# Patient Record
Sex: Male | Born: 1990 | Race: White | Hispanic: No | Marital: Single | Smoking: Current every day smoker
Health system: Southern US, Community
[De-identification: ages and names within clinical notes are randomized; demographics above are authoritative.]

## PROBLEM LIST (undated history)

## (undated) DIAGNOSIS — Z720 Tobacco use: Secondary | ICD-10-CM

## (undated) HISTORY — PX: FEMUR FRACTURE SURGERY: SHX633

## (undated) HISTORY — PX: PELVIC FRACTURE SURGERY: SHX119

---
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
## 2014-10-28 ENCOUNTER — Emergency Department: Payer: Self-pay | Admitting: Emergency Medicine

## 2014-11-17 ENCOUNTER — Emergency Department: Payer: Self-pay | Admitting: Emergency Medicine

## 2015-04-15 ENCOUNTER — Encounter (HOSPITAL_COMMUNITY): Payer: Self-pay | Admitting: Emergency Medicine

## 2015-04-15 ENCOUNTER — Emergency Department (HOSPITAL_COMMUNITY)
Admission: EM | Admit: 2015-04-15 | Discharge: 2015-04-15 | Disposition: A | Discharge status: Home / Self Care | Payer: Self-pay | Attending: Emergency Medicine | Admitting: Emergency Medicine

## 2015-04-15 ENCOUNTER — Emergency Department (HOSPITAL_COMMUNITY): Payer: Self-pay

## 2015-04-15 DIAGNOSIS — N453 Epididymo-orchitis: Secondary | ICD-10-CM | POA: Insufficient documentation

## 2015-04-15 DIAGNOSIS — Z720 Tobacco use: Secondary | ICD-10-CM | POA: Insufficient documentation

## 2015-04-15 DIAGNOSIS — Z9889 Other specified postprocedural states: Secondary | ICD-10-CM | POA: Insufficient documentation

## 2015-04-15 DIAGNOSIS — N5089 Other specified disorders of the male genital organs: Secondary | ICD-10-CM

## 2015-04-15 LAB — URINE MICROSCOPIC-ADD ON

## 2015-04-15 LAB — URINALYSIS, ROUTINE W REFLEX MICROSCOPIC
Bilirubin Urine: NEGATIVE
Glucose, UA: NEGATIVE mg/dL
Ketones, ur: NEGATIVE mg/dL
NITRITE: POSITIVE — AB
Protein, ur: NEGATIVE mg/dL
Specific Gravity, Urine: 1.02 (ref 1.005–1.030)
Urobilinogen, UA: 2 mg/dL — ABNORMAL HIGH (ref 0.0–1.0)
pH: 6.5 (ref 5.0–8.0)

## 2015-04-15 MED ORDER — NAPROXEN 500 MG PO TABS: 500.0000 mg | ORAL_TABLET | Freq: Two times a day (BID) | ORAL | Status: DC

## 2015-04-15 MED ORDER — KETOROLAC TROMETHAMINE 30 MG/ML IJ SOLN: 30.0000 mg | Freq: Once | INTRAMUSCULAR | Status: DC

## 2015-04-15 MED ORDER — LIDOCAINE HCL (PF) 1 % IJ SOLN
INTRAMUSCULAR | Status: AC
  Administered 2015-04-15: 15:00:00
  Filled 2015-04-15: qty 5

## 2015-04-15 MED ORDER — CEFTRIAXONE SODIUM 250 MG IJ SOLR
250.0000 mg | Freq: Once | INTRAMUSCULAR | Status: AC
  Administered 2015-04-15: 250 mg via INTRAMUSCULAR
  Filled 2015-04-15: qty 250

## 2015-04-15 MED ORDER — DOXYCYCLINE HYCLATE 100 MG PO TABS
100.0000 mg | ORAL_TABLET | Freq: Once | ORAL | Status: AC
  Administered 2015-04-15: 100 mg via ORAL
  Filled 2015-04-15: qty 1

## 2015-04-15 MED ORDER — HYDROCODONE-ACETAMINOPHEN 5-325 MG PO TABS: 2.0000 | ORAL_TABLET | ORAL | Status: DC | PRN

## 2015-04-15 MED ORDER — KETOROLAC TROMETHAMINE 30 MG/ML IJ SOLN
60.0000 mg | Freq: Once | INTRAMUSCULAR | Status: AC
  Administered 2015-04-15: 60 mg via INTRAMUSCULAR
  Filled 2015-04-15: qty 2

## 2015-04-15 MED ORDER — DOXYCYCLINE HYCLATE 100 MG PO CAPS: 100.0000 mg | ORAL_CAPSULE | Freq: Two times a day (BID) | ORAL | Status: DC

## 2015-04-15 NOTE — Discharge Instructions (Signed)
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
## 2015-04-15 NOTE — ED Notes (Signed)
2 days ago Rt testicle started to swell and become painful -

## 2015-04-15 NOTE — ED Notes (Signed)
MD at bedside. 

## 2015-04-15 NOTE — ED Notes (Signed)
Ultrasound at bedside at this time.

## 2015-04-16 LAB — HIV ANTIBODY (ROUTINE TESTING W REFLEX): HIV Screen 4th Generation wRfx: NONREACTIVE

## 2016-10-03 ENCOUNTER — Emergency Department
Admission: EM | Admit: 2016-10-03 | Discharge: 2016-10-03 | Disposition: A | Discharge status: Home / Self Care | Payer: Self-pay | Attending: Emergency Medicine | Admitting: Emergency Medicine

## 2016-10-03 DIAGNOSIS — F17210 Nicotine dependence, cigarettes, uncomplicated: Secondary | ICD-10-CM | POA: Insufficient documentation

## 2016-10-03 DIAGNOSIS — M549 Dorsalgia, unspecified: Secondary | ICD-10-CM | POA: Insufficient documentation

## 2016-10-03 DIAGNOSIS — J069 Acute upper respiratory infection, unspecified: Secondary | ICD-10-CM | POA: Insufficient documentation

## 2016-10-03 MED ORDER — BENZONATATE 100 MG PO CAPS: 100.0000 mg | ORAL_CAPSULE | Freq: Three times a day (TID) | ORAL | 0 refills | Status: DC | PRN

## 2016-10-03 MED ORDER — CYCLOBENZAPRINE HCL 5 MG PO TABS: 5.0000 mg | ORAL_TABLET | Freq: Three times a day (TID) | ORAL | 0 refills | Status: DC | PRN

## 2016-10-03 MED ORDER — FLUTICASONE PROPIONATE 50 MCG/ACT NA SUSP: 2.0000 | Freq: Every day | NASAL | 0 refills | Status: DC

## 2016-10-03 MED ORDER — NAPROXEN 500 MG PO TBEC: 500.0000 mg | DELAYED_RELEASE_TABLET | Freq: Two times a day (BID) | ORAL | 0 refills | Status: DC

## 2016-10-03 NOTE — Discharge Instructions (Signed)
You appear to have a viral respiratory infection. Use the nasal spray and take the cough medicine as directed. Consider using a nasal saline spray to moisturize your sinuses. Follow-up with [facility] for continued symptoms.

## 2016-10-03 NOTE — ED Triage Notes (Signed)
Pt states he feels like he has a fever and his eyes are burning - pt denies any other symptoms but then states he has the flu and that his back is hurting - pt states he "feels it in his blood" that his BP is high

## 2016-10-03 NOTE — ED Provider Notes (Signed)
[facility] Emergency Department Provider Note ____________________________________________  Time seen: 1736  I have reviewed the triage vital signs and the nursing notes.  HISTORY  Chief Complaint  Fever  HPI Bryan Marks is a 26 y.o. male presents to the ED with vague symptoms that he describes as flulike in nature. He eats states he feels as though he has a fever and his eyes feel dry and burning. He also describes some pain to the upper back that he notes is likely due to his work activities. He also states that he feels as if his blood pressure is elevated. The patient denies any frank fevers, nausea, vomiting, cough, or body aches. He does report some nasal sinuscongestion. He is not taking any medications to alleviate his symptoms over the last 2-3 days. He denies receiving the flu vaccine for the season.  History reviewed. No pertinent past medical history.  There are no active problems to display for this patient.   Past Surgical History:  Procedure Laterality Date  . PELVIC FRACTURE SURGERY      Prior to Admission medications   Medication Sig Start Date End Date Taking? Authorizing Provider  benzonatate (TESSALON PERLES) 100 MG capsule Take 1 capsule (100 mg total) by mouth 3 (three) times daily as needed for cough (Take 1-2 per dose). 10/03/16   Taleia Sadowski V Bacon Charlisa Cham, PA-C  cyclobenzaprine (FLEXERIL) 5 MG tablet Take 1 tablet (5 mg total) by mouth 3 (three) times daily as needed for muscle spasms. 10/03/16   Eppie Barhorst V Bacon Ahyana Skillin, PA-C  doxycycline (VIBRAMYCIN) 100 MG capsule Take 1 capsule (100 mg total) by mouth 2 (two) times daily. 04/15/15   Eber HongBrian Miller, MD  fluticasone (FLONASE) 50 MCG/ACT nasal spray Place 2 sprays into both nostrils daily. 10/03/16   Tryston Gilliam V Bacon Nazair Fortenberry, PA-C  HYDROcodone-acetaminophen (NORCO/VICODIN) 5-325 MG per tablet Take 2 tablets by mouth every 4 (four) hours as needed. 04/15/15   Eber HongBrian Miller, MD  naproxen (EC NAPROSYN)  500 MG EC tablet Take 1 tablet (500 mg total) by mouth 2 (two) times daily with a meal. 10/03/16   Charlesetta IvoryJenise V Bacon Lydie Stammen, PA-C    Allergies Patient has no known allergies.  No family history on file.  Social History Social History  Substance Use Topics  . Smoking status: Current Every Day Smoker    Packs/day: 0.50    Types: Cigarettes  . Smokeless tobacco: Never Used  . Alcohol use Yes     Comment: occ    Review of Systems  Constitutional: Negative for fever. Eyes: Negative for visual changes. ENT: Negative for sore throat.Reports sinus congestion and nasal drainage as above. Cardiovascular: Negative for chest pain. Respiratory: Negative for shortness of breath. Gastrointestinal: Negative for abdominal pain, vomiting and diarrhea. Genitourinary: Negative for dysuria. Musculoskeletal: Positive for back pain. Skin: Negative for rash. Neurological: Negative for headaches, focal weakness or numbness. ____________________________________________  PHYSICAL EXAM:  VITAL SIGNS: ED Triage Vitals  Enc Vitals Group     BP 10/03/16 1705 (!) 158/92     Pulse Rate 10/03/16 1705 86     Resp 10/03/16 1705 16     Temp 10/03/16 1705 98.4 F (36.9 C)     Temp Source 10/03/16 1705 Oral     SpO2 10/03/16 1705 98 %     Weight 10/03/16 1706 190 lb (86.2 kg)     Height 10/03/16 1706 6' (1.829 m)     Head Circumference --      Peak Flow --  Pain Score 10/03/16 1706 9     Pain Loc --      Pain Edu? --      Excl. in GC? --    Constitutional: Alert and oriented. Well appearing and in no distress. Head: Normocephalic and atraumatic. Eyes: Conjunctivae are normal. PERRL. Normal extraocular movements Ears: Canals clear. TMs intact bilaterally. Nose: No congestion/rhinorrhea/epistaxis. Patient with dry, flat, erythematous nasal turbinates. Mouth/Throat: Mucous membranes are moist. Uvula is midline and tonsils are flat. No oropharyngeal erythema or tonsillar exudate  noted. Cardiovascular: Normal rate, regular rhythm. Normal distal pulses. Respiratory: Normal respiratory effort. No wheezes/rales/rhonchi. Gastrointestinal: Soft and nontender. No distention. Musculoskeletal: Patient with normal spinal alignment without midline tenderness, spasm, deformity, or step-off. He is with mild general muscle tenderness over the bilateral scapulothoracic region. Nontender with normal range of motion in all extremities.  Skin:  Skin is warm, dry and intact. No rash noted. Psychiatric: Mood and affect are flat. Patient exhibits appropriate insight and judgment. ____________________________________________  INITIAL IMPRESSION / ASSESSMENT AND PLAN / ED COURSE  Afebrile Patient with subjective complaints of sinus congestion and nasal congestion. His exam is benign without any signs of any acute respiratory distress. Lungs are clear and patient otherwise is likely dealing with rhinitis of a vasomotor nature. He'll be discharged with a prescription for Tessalon Perles and Flonase, for his sinus symptoms. He'll also be discharged with ibuprofen and Flexeril for his back pain complaints. He should follow up with [facility] for ongoing symptom management.  Clinical Course    ____________________________________________  FINAL CLINICAL IMPRESSION(S) / ED DIAGNOSES  Final diagnoses:  Viral upper respiratory tract infection      Lissa Hoard, PA-C 10/03/16 1830    Governor Rooks, MD 10/03/16 2139

## 2016-10-03 NOTE — ED Notes (Signed)
See triage note, pt states 3 days ago he started to have a fever and sinus drainage. Pt denies cough however states he has had back soreness. Pt also states he has had the flu before and reports "this is the flu." Pt in NAD at this time.

## 2017-03-10 ENCOUNTER — Encounter: Payer: Self-pay | Admitting: Emergency Medicine

## 2017-03-10 ENCOUNTER — Emergency Department
Admission: EM | Admit: 2017-03-10 | Discharge: 2017-03-10 | Disposition: A | Discharge status: Home / Self Care | Payer: Self-pay | Attending: Emergency Medicine | Admitting: Emergency Medicine

## 2017-03-10 DIAGNOSIS — J340 Abscess, furuncle and carbuncle of nose: Secondary | ICD-10-CM | POA: Insufficient documentation

## 2017-03-10 DIAGNOSIS — F17210 Nicotine dependence, cigarettes, uncomplicated: Secondary | ICD-10-CM | POA: Insufficient documentation

## 2017-03-10 MED ORDER — SULFAMETHOXAZOLE-TRIMETHOPRIM 800-160 MG PO TABS: 1.0000 | ORAL_TABLET | Freq: Two times a day (BID) | ORAL | 0 refills | Status: DC

## 2017-03-10 MED ORDER — MUPIROCIN CALCIUM 2 % EX CREA: TOPICAL_CREAM | CUTANEOUS | 0 refills | Status: DC

## 2017-03-10 NOTE — ED Triage Notes (Signed)
Presents with pain to right side of nose  W/o injury  Thinks he has a possible abscess to inside
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
## 2017-03-10 NOTE — Discharge Instructions (Signed)
Begin taking antibiotics as directed twice a day for 10 days. Also use Bactroban cream on a Q-tip and applied to the inner portion of the right side of your nose 3 times a day. Follow-up with [facility] if any continued problems. Dr. Andee PolesVaught is the doctor on call to make your appointment if any continued nasal problems..Marland Kitchen

## 2017-11-14 ENCOUNTER — Encounter: Payer: Self-pay | Admitting: Emergency Medicine

## 2017-11-14 ENCOUNTER — Other Ambulatory Visit: Payer: Self-pay

## 2017-11-14 ENCOUNTER — Emergency Department
Admission: EM | Admit: 2017-11-14 | Discharge: 2017-11-14 | Disposition: A | Discharge status: Home / Self Care | Payer: Self-pay | Attending: Emergency Medicine | Admitting: Emergency Medicine

## 2017-11-14 DIAGNOSIS — K649 Unspecified hemorrhoids: Secondary | ICD-10-CM | POA: Insufficient documentation

## 2017-11-14 DIAGNOSIS — F17210 Nicotine dependence, cigarettes, uncomplicated: Secondary | ICD-10-CM | POA: Insufficient documentation

## 2017-11-14 DIAGNOSIS — Z202 Contact with and (suspected) exposure to infections with a predominantly sexual mode of transmission: Secondary | ICD-10-CM | POA: Insufficient documentation

## 2017-11-14 LAB — URINALYSIS, COMPLETE (UACMP) WITH MICROSCOPIC
Bilirubin Urine: NEGATIVE
Glucose, UA: NEGATIVE mg/dL
Hgb urine dipstick: NEGATIVE
KETONES UR: NEGATIVE mg/dL
LEUKOCYTES UA: NEGATIVE
Nitrite: POSITIVE — AB
PH: 7 (ref 5.0–8.0)
Protein, ur: NEGATIVE mg/dL
Specific Gravity, Urine: 1.021 (ref 1.005–1.030)

## 2017-11-14 LAB — CHLAMYDIA/NGC RT PCR (ARMC ONLY)
CHLAMYDIA TR: NOT DETECTED
N GONORRHOEAE: NOT DETECTED

## 2017-11-14 MED ORDER — AZITHROMYCIN 500 MG PO TABS
1000.0000 mg | ORAL_TABLET | Freq: Once | ORAL | Status: AC
  Administered 2017-11-14: 1000 mg via ORAL
  Filled 2017-11-14: qty 2

## 2017-11-14 MED ORDER — LIDOCAINE HCL (PF) 1 % IJ SOLN
INTRAMUSCULAR | Status: AC
  Filled 2017-11-14: qty 5

## 2017-11-14 MED ORDER — HYDROCORTISONE ACETATE 25 MG RE SUPP: 25.0000 mg | Freq: Two times a day (BID) | RECTAL | 0 refills | Status: DC

## 2017-11-14 MED ORDER — TRAMADOL HCL 50 MG PO TABS: 50.0000 mg | ORAL_TABLET | Freq: Four times a day (QID) | ORAL | 0 refills | Status: DC | PRN

## 2017-11-14 MED ORDER — CEFTRIAXONE SODIUM 250 MG IJ SOLR
250.0000 mg | Freq: Once | INTRAMUSCULAR | Status: AC
  Administered 2017-11-14: 250 mg via INTRAMUSCULAR
  Filled 2017-11-14: qty 250

## 2017-11-14 NOTE — Discharge Instructions (Signed)
Follow-up with [facility] for additional testing.  Your chlamydia and gonorrhea test will be back later today.  A nurse will call you with results.  He has already been treated for chlamydia and gonorrhea by the Rocephin injection and the Zithromax that you were given here in the emergency department.  Do not have sex for at least 1 week.  Use condoms when having sex so that you do not infect someone else and they do not affect you.  Notify any partners of your test are positive.  The hemorrhoid, use the Anusol HC suppositories to help decrease the size of the hemorrhoid.  Soak in a warm tub of water with Epsom salts.  This will help to decrease the size of the hemorrhoid.  Use a stool softener such as MiraLAX so that you will not straining during a bowel movement.  The hemorrhoid is worsening, please return the emergency department

## 2017-11-14 NOTE — ED Notes (Signed)
Lab made aware that Chlamydia pcr testing tubes not available to separate urine for the tests. Labels applied to the urine cup for both tests.

## 2017-11-14 NOTE — ED Notes (Signed)
Pt is currently staying with another family member  [phone]

## 2017-11-14 NOTE — ED Triage Notes (Signed)
Presents with possible abscess area to buttock area for the past several days  Also wants to be checked for STD

## 2017-11-14 NOTE — ED Provider Notes (Signed)
[facility] Emergency Department Provider Note  ____________________________________________   First MD Initiated Contact with Patient 11/14/17 1513     (approximate)  I have reviewed the triage vital signs and the nursing notes.   HISTORY  Chief Complaint Exposure to STD and Abscess    HPI Bryan Marks is a 27 y.o. male presents to the emergency department stating that he has a possible abscess to the area near his rectum, and he is afraid he has an STD as he has had unprotected sex with a male.  He denies any fever or chills.  He denies any penile discharge at this time.  He denies any dysuria  History reviewed. No pertinent past medical history.  There are no active problems to display for this patient.   Past Surgical History:  Procedure Laterality Date  . FEMUR FRACTURE SURGERY     rod was placed  . PELVIC FRACTURE SURGERY      Prior to Admission medications   Medication Sig Start Date End Date Taking? Authorizing Provider  hydrocortisone (ANUSOL-HC) 25 MG suppository Place 1 suppository (25 mg total) rectally 2 (two) times daily. 11/14/17   Belissa Kooy, Roselyn BeringSusan W, PA-C  traMADol (ULTRAM) 50 MG tablet Take 1 tablet (50 mg total) by mouth every 6 (six) hours as needed. 11/14/17   Faythe GheeFisher, Mary-Ann Pennella W, PA-C    Allergies Patient has no known allergies.  No family history on file.  Social History Social History   Tobacco Use  . Smoking status: Current Every Day Smoker    Packs/day: 0.50    Types: Cigarettes  . Smokeless tobacco: Never Used  Substance Use Topics  . Alcohol use: Yes    Comment: occ  . Drug use: No    Review of Systems  Constitutional: No fever/chills Eyes: No visual changes. ENT: No sore throat. Respiratory: Denies cough Genitourinary: Negative for dysuria.  Patient will STD Musculoskeletal: Negative for back pain. Skin: Negative for rash.  Positive for a questionable abscess near the  rectum    ____________________________________________   PHYSICAL EXAM:  VITAL SIGNS: ED Triage Vitals [11/14/17 1451]  Enc Vitals Group     BP 132/84     Pulse Rate 87     Resp 20     Temp 98.1 F (36.7 C)     Temp Source Oral     SpO2 98 %     Weight 197 lb (89.4 kg)     Height 6\' 1"  (1.854 m)     Head Circumference      Peak Flow      Pain Score 5     Pain Loc      Pain Edu?      Excl. in GC?     Constitutional: Alert and oriented. Well appearing and in no acute distress. Eyes: Conjunctivae are normal.  Head: Atraumatic. Nose: No congestion/rhinnorhea. Mouth/Throat: Mucous membranes are moist.   Cardiovascular: Normal rate, regular rhythm.  Heart sounds are normal Respiratory: Normal respiratory effort.  No retractions, lungs are clear to auscultation Rectal exam: Shows a swollen hemorrhoid at 1:00.  It is not thrombosed and there is no drainage from the area GU: No penile lesions or discharge are noted Musculoskeletal: FROM all extremities, warm and well perfused Neurologic:  Normal speech and language.  Skin:  Skin is warm, dry and intact. No rash noted. Psychiatric: Mood and affect are normal. Speech and behavior are normal.  ____________________________________________   LABS (all labs ordered are listed, but  only abnormal results are displayed)  Labs Reviewed  URINALYSIS, COMPLETE (UACMP) WITH MICROSCOPIC - Abnormal; Notable for the following components:      Result Value   Color, Urine YELLOW (*)    APPearance CLEAR (*)    Nitrite POSITIVE (*)    Bacteria, UA RARE (*)    Squamous Epithelial / LPF 0-5 (*)    All other components within normal limits  CHLAMYDIA/NGC RT PCR ([facility] ONLY)   ____________________________________________   ____________________________________________  RADIOLOGY    ____________________________________________   PROCEDURES  Procedure(s) performed:  No  Procedures    ____________________________________________   INITIAL IMPRESSION / ASSESSMENT AND PLAN / ED COURSE  Pertinent labs & imaging results that were available during my care of the patient were reviewed by me and considered in my medical decision making (see chart for details).  Patient is 27 year old male complaining of a questionable abscess near the rectum and questionable STD.  On physical exam he has a hemorrhoid that is swollen and tender.  There is no abscess noted.  Urinalysis for STD was obtained.  There is no penile lesions or discharge    ----------------------------------------- 5:19 PM on 11/14/2017 -----------------------------------------  The lab called and said that the results are inconclusive and they will need to run the test on the chlamydia and gonorrhea again.  Discussed this with the patient.  We decided to go ahead and treat him for chlamydia and gonorrhea while here in the ED.  He is to follow-up with the health department if the results are positive.  He is to notify his partners of the results are positive.  He is not have sex for 1 week.  The patient states he understands will comply with instructions. For the hemorrhoid he was given a prescription for Anusol HC, tramadol 50 mg #15 1 every 6 hours as needed for pain.  He is to soak in a warm tub of water with Epsom salts.  If the hemorrhoid is worsening he is to return to the emergency department  As part of my medical decision making, I reviewed the following data within the electronic MEDICAL RECORD NUMBER Nursing notes reviewed and incorporated, Labs reviewed UA is positive for nitrites, GC chlamydia are pending, Notes from prior ED visits and [place] Controlled Substance Database  ____________________________________________   FINAL CLINICAL IMPRESSION(S) / ED DIAGNOSES  Final diagnoses:  Exposure to STD  Acute hemorrhoid      NEW MEDICATIONS STARTED DURING THIS VISIT:  New Prescriptions    HYDROCORTISONE (ANUSOL-HC) 25 MG SUPPOSITORY    Place 1 suppository (25 mg total) rectally 2 (two) times daily.   TRAMADOL (ULTRAM) 50 MG TABLET    Take 1 tablet (50 mg total) by mouth every 6 (six) hours as needed.     Note:  This document was prepared using Dragon voice recognition software and may include unintentional dictation errors.    Faythe Ghee, PA-C 11/14/17 1722    Faythe Ghee, PA-C 11/14/17 2014    Minna Antis, MD 11/14/17 [phone]

## 2018-03-09 ENCOUNTER — Emergency Department: Payer: Self-pay

## 2018-03-09 ENCOUNTER — Emergency Department
Admission: EM | Admit: 2018-03-09 | Discharge: 2018-03-10 | Disposition: A | Discharge status: Home / Self Care | Payer: Self-pay | Attending: Emergency Medicine | Admitting: Emergency Medicine

## 2018-03-09 ENCOUNTER — Encounter: Payer: Self-pay | Admitting: Emergency Medicine

## 2018-03-09 ENCOUNTER — Other Ambulatory Visit: Payer: Self-pay

## 2018-03-09 DIAGNOSIS — S93402A Sprain of unspecified ligament of left ankle, initial encounter: Secondary | ICD-10-CM | POA: Insufficient documentation

## 2018-03-09 DIAGNOSIS — F17210 Nicotine dependence, cigarettes, uncomplicated: Secondary | ICD-10-CM | POA: Insufficient documentation

## 2018-03-09 DIAGNOSIS — S92142A Displaced dome fracture of left talus, initial encounter for closed fracture: Secondary | ICD-10-CM

## 2018-03-09 DIAGNOSIS — S92152A Displaced avulsion fracture (chip fracture) of left talus, initial encounter for closed fracture: Secondary | ICD-10-CM | POA: Insufficient documentation

## 2018-03-09 DIAGNOSIS — Y9389 Activity, other specified: Secondary | ICD-10-CM | POA: Insufficient documentation

## 2018-03-09 DIAGNOSIS — Y999 Unspecified external cause status: Secondary | ICD-10-CM | POA: Insufficient documentation

## 2018-03-09 DIAGNOSIS — Y929 Unspecified place or not applicable: Secondary | ICD-10-CM | POA: Insufficient documentation

## 2018-03-09 MED ORDER — OXYCODONE-ACETAMINOPHEN 5-325 MG PO TABS: 1.0000 | ORAL_TABLET | ORAL | 0 refills | Status: AC | PRN

## 2018-03-09 MED ORDER — OXYCODONE-ACETAMINOPHEN 5-325 MG PO TABS
1.0000 | ORAL_TABLET | Freq: Once | ORAL | Status: AC
  Administered 2018-03-09: 1 via ORAL
  Filled 2018-03-09: qty 1
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
## 2018-03-09 NOTE — ED Notes (Signed)
Pt discharged to home.  Family member driving.  Discharge instructions reviewed.  Verbalized understanding.  No questions or concerns at this time.  Teach back verified.  Pt in NAD.  No items left in ED.   

## 2018-03-09 NOTE — ED Notes (Signed)
ED Provider at bedside. 
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
## 2018-03-10 NOTE — ED Notes (Signed)
This RN reviewed discharge instructions, follow-up care, prescriptions, cryotherapy, splint care, and need for elevation with patient. Patient verbalized understanding of all reviewed information.  Patient stable, with no distress noted at this time. 

## 2018-03-21 ENCOUNTER — Other Ambulatory Visit: Payer: Self-pay

## 2018-03-21 ENCOUNTER — Emergency Department
Admission: EM | Admit: 2018-03-21 | Discharge: 2018-03-21 | Disposition: A | Discharge status: Home / Self Care | Payer: Self-pay | Attending: Emergency Medicine | Admitting: Emergency Medicine

## 2018-03-21 DIAGNOSIS — F17210 Nicotine dependence, cigarettes, uncomplicated: Secondary | ICD-10-CM | POA: Insufficient documentation

## 2018-03-21 DIAGNOSIS — X58XXXD Exposure to other specified factors, subsequent encounter: Secondary | ICD-10-CM | POA: Insufficient documentation

## 2018-03-21 DIAGNOSIS — S92101G Unspecified fracture of right talus, subsequent encounter for fracture with delayed healing: Secondary | ICD-10-CM

## 2018-03-21 DIAGNOSIS — S92151G Displaced avulsion fracture (chip fracture) of right talus, subsequent encounter for fracture with delayed healing: Secondary | ICD-10-CM | POA: Insufficient documentation

## 2018-03-21 DIAGNOSIS — S82892D Other fracture of left lower leg, subsequent encounter for closed fracture with routine healing: Secondary | ICD-10-CM | POA: Insufficient documentation

## 2018-03-21 NOTE — ED Provider Notes (Signed)
[facility] Emergency Department Provider Note   ____________________________________________   None    (approximate)  I have reviewed the triage vital signs and the nursing notes.   HISTORY  Chief Complaint Ankle Pain    HPI Bryan Marks is a 27 y.o. male Mrs. no reports back to ED status post 2 weeks left ankle fracture.  Patient stated splint got wet.  Patient said he went to orthopedics but was unable to pay a front feet.  Patient return back to ED to be resplinted.  History reviewed. No pertinent past medical history.  There are no active problems to display for this patient.   Past Surgical History:  Procedure Laterality Date  . FEMUR FRACTURE SURGERY     rod was placed  . PELVIC FRACTURE SURGERY      Prior to Admission medications   Medication Sig Start Date End Date Taking? Authorizing Provider  hydrocortisone (ANUSOL-HC) 25 MG suppository Place 1 suppository (25 mg total) rectally 2 (two) times daily. 11/14/17   Fisher, Roselyn Bering, PA-C  oxyCODONE-acetaminophen (PERCOCET) 5-325 MG tablet Take 1 tablet by mouth every 4 (four) hours as needed for moderate pain or severe pain. 03/09/18 03/09/19  Schaevitz, Myra Rude, MD  traMADol (ULTRAM) 50 MG tablet Take 1 tablet (50 mg total) by mouth every 6 (six) hours as needed. 11/14/17   Faythe Ghee, PA-C    Allergies Patient has no known allergies.  No family history on file.  Social History Social History   Tobacco Use  . Smoking status: Current Every Day Smoker    Packs/day: 0.50    Types: Cigarettes  . Smokeless tobacco: Never Used  Substance Use Topics  . Alcohol use: Yes    Comment: occ  . Drug use: No    Review of Systems Constitutional: No fever/chills Eyes: No visual changes. ENT: No sore throat. Cardiovascular: Denies chest pain. Respiratory: Denies shortness of breath. Gastrointestinal: No abdominal pain.  No nausea, no vomiting.  No diarrhea.  No  constipation. Genitourinary: Negative for dysuria. Musculoskeletal: Chip fracture from the talus left ankle Skin: Negative for rash. Neurological: Negative for headaches, focal weakness or numbness.   ____________________________________________   PHYSICAL EXAM:  VITAL SIGNS: ED Triage Vitals [03/21/18 0839]  Enc Vitals Group     BP 123/86     Pulse Rate 75     Resp 14     Temp      Temp Source Oral     SpO2 98 %     Weight 198 lb (89.8 kg)     Height 6\' 1"  (1.854 m)     Head Circumference      Peak Flow      Pain Score 8     Pain Loc      Pain Edu?      Excl. in GC?    Constitutional: Alert and oriented. Well appearing and in no acute distress. Hematological/Lymphatic/Immunilogical: No cervical lymphadenopathy. Cardiovascular: Normal rate, regular rhythm. Grossly normal heart sounds.  Good peripheral circulation. Respiratory: Normal respiratory effort.  No retractions. Lungs CTAB. Musculoskeletal: Ankle splint is decaying.  No obvious deformity or edema to the ankle.   Neurologic:  Normal speech and language. No gross focal neurologic deficits are appreciated. No gait instability. Skin:  Skin is warm, dry and intact. No rash noted. Psychiatric: Mood and affect are normal. Speech and behavior are normal.  ____________________________________________   LABS (all labs ordered are listed, but only abnormal results are displayed)  Labs Reviewed - No data to display ____________________________________________  EKG   ____________________________________________  RADIOLOGY  ED MD interpretation:  Official radiology report(s): No results found.  ____________________________________________   PROCEDURES  Procedure(s) performed: None  Procedures  Critical Care performed: No  ____________________________________________   INITIAL IMPRESSION / ASSESSMENT AND PLAN / ED COURSE  As part of my medical decision making, I reviewed the following data within the  electronic MEDICAL RECORD NUMBER    Chipped  fracture superior aspect of the left talus.  Patient advised definitive care must be done by podiatrist.  Patient was resplinted given discharge care instructions.      ____________________________________________   FINAL CLINICAL IMPRESSION(S) / ED DIAGNOSES  Final diagnoses:  Closed fracture of left ankle with routine healing, subsequent encounter  Closed displaced fracture of right talus with delayed healing, unspecified fracture morphology, subsequent encounter     ED Discharge Orders    None       Note:  This document was prepared using Dragon voice recognition software and may include unintentional dictation errors.    Joni ReiningSmith, Candon Caras K, PA-C 03/21/18 [id number]    Minna AntisPaduchowski, Kevin, MD 03/21/18 [phone]

## 2018-03-21 NOTE — Discharge Instructions (Addendum)
Wear splint as directed and to follow-up with podiatry.
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
## 2018-05-01 ENCOUNTER — Encounter: Payer: Self-pay | Admitting: Emergency Medicine

## 2018-05-01 ENCOUNTER — Emergency Department
Admission: EM | Admit: 2018-05-01 | Discharge: 2018-05-01 | Disposition: A | Discharge status: Home / Self Care | Payer: Self-pay | Attending: Emergency Medicine | Admitting: Emergency Medicine

## 2018-05-01 DIAGNOSIS — Z79899 Other long term (current) drug therapy: Secondary | ICD-10-CM | POA: Insufficient documentation

## 2018-05-01 DIAGNOSIS — F17210 Nicotine dependence, cigarettes, uncomplicated: Secondary | ICD-10-CM | POA: Insufficient documentation

## 2018-05-01 DIAGNOSIS — N342 Other urethritis: Secondary | ICD-10-CM | POA: Insufficient documentation

## 2018-05-01 LAB — URINALYSIS, COMPLETE (UACMP) WITH MICROSCOPIC
Bilirubin Urine: NEGATIVE
Glucose, UA: NEGATIVE mg/dL
KETONES UR: NEGATIVE mg/dL
Nitrite: POSITIVE — AB
Protein, ur: 100 mg/dL — AB
Specific Gravity, Urine: 1.03 (ref 1.005–1.030)
pH: 5 (ref 5.0–8.0)

## 2018-05-01 LAB — CHLAMYDIA/NGC RT PCR (ARMC ONLY)
Chlamydia Tr: NOT DETECTED
N GONORRHOEAE: NOT DETECTED

## 2018-05-01 MED ORDER — LIDOCAINE HCL (PF) 1 % IJ SOLN
2.1000 mL | Freq: Once | INTRAMUSCULAR | Status: AC
  Administered 2018-05-01: 2.1 mL
  Filled 2018-05-01: qty 5

## 2018-05-01 MED ORDER — CEFTRIAXONE SODIUM 1 G IJ SOLR
1.0000 g | Freq: Once | INTRAMUSCULAR | Status: AC
  Administered 2018-05-01: 1 g via INTRAMUSCULAR
  Filled 2018-05-01: qty 10

## 2018-05-01 MED ORDER — CEPHALEXIN 500 MG PO CAPS: 500.0000 mg | ORAL_CAPSULE | Freq: Four times a day (QID) | ORAL | 0 refills | Status: DC

## 2018-05-01 NOTE — ED Notes (Signed)
Patient discharged to home per MD order. Patient in stable condition, and deemed medically cleared by ED provider for discharge. Discharge instructions reviewed with patient/family using "Teach Back"; verbalized understanding of medication education and administration, and information about follow-up care. Denies further concerns. ° °

## 2018-05-01 NOTE — ED Triage Notes (Signed)
Pt reports Friday started with pain to his penis worsening after he urinates. Pt admits to unprotected sex sometimes.

## 2018-05-01 NOTE — ED Notes (Signed)
See triage note. Pt a/o, nad.

## 2018-05-01 NOTE — ED Provider Notes (Signed)
[facility] Emergency Department Provider Note  ____________________________________________  Time seen: Approximately 6:11 PM  I have reviewed the triage vital signs and the nursing notes.   HISTORY  Chief Complaint Penile Discharge    HPI Bryan Marks is a 27 y.o. male since the emergency department complaining of dysuria, mild hematuria.  Patient presents complaining of 2-week history of worsening burning with urination.  Patient reports that he will typically have a sharp burning with the onset of urination, then a burn at the finish of urination with a small amount of blood in his urine.  Patient denies any penile or scrotal pain.  He denies any discharge.  He denies any abdominal pain, flank pain.  No history of kidney stones.  Patient denies any other complaints at this time.  No medications for this complaint prior to arrival.  No history of STDs.    History reviewed. No pertinent past medical history.  There are no active problems to display for this patient.   Past Surgical History:  Procedure Laterality Date  . FEMUR FRACTURE SURGERY     rod was placed  . PELVIC FRACTURE SURGERY      Prior to Admission medications   Medication Sig Start Date End Date Taking? Authorizing Provider  cephALEXin (KEFLEX) 500 MG capsule Take 1 capsule (500 mg total) by mouth 4 (four) times daily. 05/01/18   Makeshia Seat, Delorise RoyalsJonathan D, PA-C  hydrocortisone (ANUSOL-HC) 25 MG suppository Place 1 suppository (25 mg total) rectally 2 (two) times daily. 11/14/17   Fisher, Roselyn BeringSusan W, PA-C  oxyCODONE-acetaminophen (PERCOCET) 5-325 MG tablet Take 1 tablet by mouth every 4 (four) hours as needed for moderate pain or severe pain. 03/09/18 03/09/19  Schaevitz, Myra Rudeavid Matthew, MD  traMADol (ULTRAM) 50 MG tablet Take 1 tablet (50 mg total) by mouth every 6 (six) hours as needed. 11/14/17   Faythe GheeFisher, Susan W, PA-C    Allergies Patient has no known allergies.  No family history on  file.  Social History Social History   Tobacco Use  . Smoking status: Current Every Day Smoker    Packs/day: 0.50    Types: Cigarettes  . Smokeless tobacco: Never Used  Substance Use Topics  . Alcohol use: Yes    Comment: occ  . Drug use: No     Review of Systems  Constitutional: No fever/chills Eyes: No visual changes. No discharge ENT: No upper respiratory complaints. Cardiovascular: no chest pain. Respiratory: no cough. No SOB. Gastrointestinal: No abdominal pain.  No nausea, no vomiting.  No diarrhea.  No constipation. Genitourinary: Positive for dysuria.  Minimal hematuria at the end of voiding Musculoskeletal: Negative for musculoskeletal pain. Skin: Negative for rash, abrasions, lacerations, ecchymosis. Neurological: Negative for headaches, focal weakness or numbness. 10-point ROS otherwise negative.  ____________________________________________   PHYSICAL EXAM:  VITAL SIGNS: ED Triage Vitals  Enc Vitals Group     BP 05/01/18 1802 126/74     Pulse Rate 05/01/18 1802 66     Resp 05/01/18 1802 20     Temp 05/01/18 1802 98 F (36.7 C)     Temp Source 05/01/18 1802 Oral     SpO2 05/01/18 1802 97 %     Weight 05/01/18 1801 190 lb (86.2 kg)     Height 05/01/18 1801 6\' 1"  (1.854 m)     Head Circumference --      Peak Flow --      Pain Score 05/01/18 1801 10     Pain Loc --  Pain Edu? --      Excl. in GC? --      Constitutional: Alert and oriented. Well appearing and in no acute distress. Eyes: Conjunctivae are normal. PERRL. EOMI. Head: Atraumatic. Neck: No stridor.    Cardiovascular: Normal rate, regular rhythm. Normal S1 and S2.  Good peripheral circulation. Respiratory: Normal respiratory effort without tachypnea or retractions. Lungs CTAB. Good air entry to the bases with no decreased or absent breath sounds. Gastrointestinal: Bowel sounds 4 quadrants. Soft and nontender to palpation. No guarding or rigidity. No palpable masses. No distention. No  CVA tenderness. Genitourinary: No visible abnormality or discharge. Musculoskeletal: Full range of motion to all extremities. No gross deformities appreciated. Neurologic:  Normal speech and language. No gross focal neurologic deficits are appreciated.  Skin:  Skin is warm, dry and intact. No rash noted. Psychiatric: Mood and affect are normal. Speech and behavior are normal. Patient exhibits appropriate insight and judgement.   ____________________________________________   LABS (all labs ordered are listed, but only abnormal results are displayed)  Labs Reviewed  URINALYSIS, COMPLETE (UACMP) WITH MICROSCOPIC - Abnormal; Notable for the following components:      Result Value   Color, Urine YELLOW (*)    APPearance CLOUDY (*)    Hgb urine dipstick LARGE (*)    Protein, ur 100 (*)    Nitrite POSITIVE (*)    Leukocytes, UA MODERATE (*)    RBC / HPF >50 (*)    WBC, UA >50 (*)    Bacteria, UA RARE (*)    All other components within normal limits  CHLAMYDIA/NGC RT PCR ([facility] ONLY)   ____________________________________________  EKG   ____________________________________________  RADIOLOGY   No results found.  ____________________________________________    PROCEDURES  Procedure(s) performed:    Procedures    Medications  cefTRIAXone (ROCEPHIN) injection 1 g (has no administration in time range)  lidocaine (PF) (XYLOCAINE) 1 % injection 2.1 mL (has no administration in time range)     ____________________________________________   INITIAL IMPRESSION / ASSESSMENT AND PLAN / ED COURSE  Pertinent labs & imaging results that were available during my care of the patient were reviewed by me and considered in my medical decision making (see chart for details).  Review of the [place] CSRS was performed in accordance of the [facility] prior to dispensing any controlled drugs.      Patient's diagnosis is consistent with bacterial urethritis.  Patient presents the  emergency department with burning with urination.  He reports that in the end of his stream he has had hematuria as well.  Urinalysis is positive with leukocytes, nitrites and some blood.  Patient has a negative gonorrhea and chlamydia here in the emergency department.  He will be treated with Rocephin here in the emergency department and Keflex outpatient.  Patient is given return precautions.  No indication for further work-up to include imaging or other labs at this time..  Patient will follow primary care as needed.  Patient is given ED precautions to return to the ED for any worsening or new symptoms.     ____________________________________________  FINAL CLINICAL IMPRESSION(S) / ED DIAGNOSES  Final diagnoses:  Infective urethritis      NEW MEDICATIONS STARTED DURING THIS VISIT:  ED Discharge Orders        Ordered    cephALEXin (KEFLEX) 500 MG capsule  4 times daily     05/01/18 2053          This chart was dictated using voice recognition  software/Dragon. Despite best efforts to proofread, errors can occur which can change the meaning. Any change was purely unintentional.    Racheal Patches, PA-C 05/01/18 2053    Loleta Rose, MD 05/01/18 [phone]

## 2018-05-09 ENCOUNTER — Other Ambulatory Visit: Payer: Self-pay

## 2018-05-09 ENCOUNTER — Encounter: Payer: Self-pay | Admitting: Emergency Medicine

## 2018-05-09 ENCOUNTER — Emergency Department
Admission: EM | Admit: 2018-05-09 | Discharge: 2018-05-09 | Disposition: A | Discharge status: Home / Self Care | Payer: Self-pay | Attending: Student in an Organized Health Care Education/Training Program | Admitting: Student in an Organized Health Care Education/Training Program

## 2018-05-09 DIAGNOSIS — F17210 Nicotine dependence, cigarettes, uncomplicated: Secondary | ICD-10-CM | POA: Insufficient documentation

## 2018-05-09 DIAGNOSIS — N342 Other urethritis: Secondary | ICD-10-CM | POA: Insufficient documentation

## 2018-05-09 DIAGNOSIS — Z8619 Personal history of other infectious and parasitic diseases: Secondary | ICD-10-CM | POA: Insufficient documentation

## 2018-05-09 LAB — URINALYSIS, COMPLETE (UACMP) WITH MICROSCOPIC
BACTERIA UA: NONE SEEN
Bilirubin Urine: NEGATIVE
Glucose, UA: NEGATIVE mg/dL
Ketones, ur: NEGATIVE mg/dL
Nitrite: NEGATIVE
PH: 7 (ref 5.0–8.0)
Protein, ur: 100 mg/dL — AB
RBC / HPF: >50 RBC/hpf — ABNORMAL HIGH (ref 0–5)
Specific Gravity, Urine: 1.026 (ref 1.005–1.030)

## 2018-05-09 MED ORDER — DOXYCYCLINE HYCLATE 100 MG PO TABS
100.0000 mg | ORAL_TABLET | Freq: Once | ORAL | Status: AC
  Administered 2018-05-09: 100 mg via ORAL
  Filled 2018-05-09: qty 1

## 2018-05-09 MED ORDER — AZITHROMYCIN 500 MG PO TABS
1000.0000 mg | ORAL_TABLET | Freq: Once | ORAL | Status: AC
  Administered 2018-05-09: 1000 mg via ORAL
  Filled 2018-05-09: qty 2

## 2018-05-09 MED ORDER — DOXYCYCLINE HYCLATE 100 MG PO TABS: 100.0000 mg | ORAL_TABLET | Freq: Two times a day (BID) | ORAL | 0 refills | Status: AC

## 2018-05-09 MED ORDER — CEFTRIAXONE SODIUM 250 MG IJ SOLR
250.0000 mg | Freq: Once | INTRAMUSCULAR | Status: AC
  Administered 2018-05-09: 250 mg via INTRAMUSCULAR
  Filled 2018-05-09: qty 250

## 2018-05-09 MED ORDER — ACYCLOVIR 400 MG PO TABS: 400.0000 mg | ORAL_TABLET | Freq: Three times a day (TID) | ORAL | 0 refills | Status: AC

## 2018-05-09 NOTE — ED Triage Notes (Addendum)
Patient reports pain in groin and scrotum x2 weeks. States that he has also had blood in his urine x2 weeks. Patient denies swelling or discoloration. Patient seen in ED a week ago for same and given prescription for Keflex. Patient reports compliance with medication.

## 2018-05-09 NOTE — ED Notes (Signed)
Pt ambulatory upon discharge; declined wheel chair. Verbalized understanding of discharge instructions, follow-up care and prescriptions. VSS. Skin warm and dry. A&O x4.  

## 2018-05-09 NOTE — ED Notes (Signed)
While walking to bathroom to obtain a urine specimen pt stated "Can I not get some pain meds?"

## 2018-05-09 NOTE — ED Provider Notes (Signed)
[facility] Emergency Department Provider Note    None    (approximate)  I have reviewed the triage vital signs and the nursing notes.   HISTORY  Chief Complaint Hematuria and Groin Pain    HPI Bryan Marks is a 27 y.o. male with a history of exposure to STD and 2 weeks of hematuria dysuria and pain along the tip of his penis and urethra Ri presents the ER after being started on Keflex for presumed UTI and urethritis.  Denies any fevers at home.  Denies any flank pain or abdominal pain.  States the pain is burning and severe in nature when urinating.  Has never had pain like this.  [place] prior to onset of symptoms and he was not using barrier protection.  History reviewed. No pertinent past medical history. No family history on file. Past Surgical History:  Procedure Laterality Date  . FEMUR FRACTURE SURGERY     rod was placed  . PELVIC FRACTURE SURGERY     There are no active problems to display for this patient.     Prior to Admission medications   Medication Sig Start Date End Date Taking? Authorizing Provider  cephALEXin (KEFLEX) 500 MG capsule Take 1 capsule (500 mg total) by mouth 4 (four) times daily. 05/01/18   Cuthriell, Delorise RoyalsJonathan D, PA-C  hydrocortisone (ANUSOL-HC) 25 MG suppository Place 1 suppository (25 mg total) rectally 2 (two) times daily. 11/14/17   Fisher, Roselyn BeringSusan W, PA-C  oxyCODONE-acetaminophen (PERCOCET) 5-325 MG tablet Take 1 tablet by mouth every 4 (four) hours as needed for moderate pain or severe pain. 03/09/18 03/09/19  Schaevitz, Myra Rudeavid Matthew, MD  traMADol (ULTRAM) 50 MG tablet Take 1 tablet (50 mg total) by mouth every 6 (six) hours as needed. 11/14/17   Faythe GheeFisher, Susan W, PA-C    Allergies Patient has no known allergies.    Social History Social History   Tobacco Use  . Smoking status: Current Every Day Smoker    Packs/day: 0.50    Types: Cigarettes  . Smokeless tobacco: Never  Used  Substance Use Topics  . Alcohol use: Yes    Comment: occ  . Drug use: No    Review of Systems Patient denies headaches, rhinorrhea, blurry vision, numbness, shortness of breath, chest pain, edema, cough, abdominal pain, nausea, vomiting, diarrhea, dysuria, fevers, rashes or hallucinations unless otherwise stated above in HPI. ____________________________________________   PHYSICAL EXAM:  VITAL SIGNS: Vitals:   05/09/18 1456 05/09/18 1558  BP: 135/84 132/83  Pulse: 85 63  Resp: 20 18  Temp: 98.2 F (36.8 C)   SpO2: 98% 100%    Constitutional: Alert and oriented. Well appearing and in no acute distress. Eyes: Conjunctivae are normal.  Head: Atraumatic. Nose: No congestion/rhinnorhea. Mouth/Throat: Mucous membranes are moist.   Neck: Painless ROM.  Cardiovascular:   Good peripheral circulation. Respiratory: Normal respiratory effort.  No retractions.  Gastrointestinal: Soft and nontender.  GU: Normal external genitalia pain is reproduced along the urethra.  No scrotal swelling.  Cremasteric reflex present bilaterally.  No evidence of internal hernia. Musculoskeletal: No lower extremity tenderness .  No joint effusions. Neurologic:  Normal speech and language. No gross focal neurologic deficits are appreciated.  Skin:  Skin is warm, dry and intact. No rash noted. Psychiatric: Mood and affect are normal. Speech and behavior are normal.  ____________________________________________   LABS (all labs ordered are listed, but only abnormal results are displayed)  Results for  orders placed or performed during the hospital encounter of 05/09/18 (from the past 24 hour(s))  Urinalysis, Complete w Microscopic     Status: Abnormal   Collection Time: 05/09/18  3:08 PM  Result Value Ref Range   Color, Urine YELLOW (A) YELLOW   APPearance CLOUDY (A) CLEAR   Specific Gravity, Urine 1.026 1.005 - 1.030   pH 7.0 5.0 - 8.0   Glucose, UA NEGATIVE NEGATIVE mg/dL   Hgb urine  dipstick LARGE (A) NEGATIVE   Bilirubin Urine NEGATIVE NEGATIVE   Ketones, ur NEGATIVE NEGATIVE mg/dL   Protein, ur 562100 (A) NEGATIVE mg/dL   Nitrite NEGATIVE NEGATIVE   Leukocytes, UA MODERATE (A) NEGATIVE   RBC / HPF >50 (H) 0 - 5 RBC/hpf   WBC, UA >50 (H) 0 - 5 WBC/hpf   Bacteria, UA NONE SEEN NONE SEEN   Squamous Epithelial / LPF 0-5 0 - 5   ____________________________________________ ____________________________________________   PROCEDURES  Procedure(s) performed:  Procedures    Critical Care performed: no ____________________________________________   INITIAL IMPRESSION / ASSESSMENT AND PLAN / ED COURSE  Pertinent labs & imaging results that were available during my care of the patient were reviewed by me and considered in my medical decision making (see chart for details).  DDX: std, urethritis, stone, hernia  Bryan Marks is a 27 y.o. who presents to the ED with symptoms as described above.Patient is AFVSS in ED. Exam as above. Given current presentation have considered the above differential.  Patient with clinical evidence of acute urethritis.  Patient will be given Rocephin, Zithromax as well as doxycycline.  We will also cover with acyclovir due to concern for herpes.  Will have patient follow-up with public health department.  Instructed abstain from intercourse until cleared of symptoms.  Discussed the risk of reinfection and spreading infection to other partners.  Have discussed with the patient and available family all diagnostics and treatments performed thus far and all questions were answered to the best of my ability. The patient demonstrates understanding and agreement with plan.       ____________________________________________   FINAL CLINICAL IMPRESSION(S) / ED DIAGNOSES  Final diagnoses:  Urethritis      NEW MEDICATIONS STARTED DURING THIS VISIT:  New Prescriptions   No medications on file     Note:  This document was prepared using  Dragon voice recognition software and may include unintentional dictation errors. ;ex   Willy Eddyobinson, Lynne Righi, MD 05/09/18 1608

## 2018-05-09 NOTE — ED Notes (Addendum)
Pt ambulatory from lobby to ED36 with this RN.   Pt reports sharp pain when he "pushes real hard to pee" with blood in urine x 2-3 weeks. Was here this past week and told he might have kidney stones. States he is okay to sit and stand but has increased pain with walking and bending.  Pain in scrotum.

## 2018-05-09 NOTE — ED Notes (Signed)
ED Provider at bedside. 

## 2019-01-17 ENCOUNTER — Encounter: Payer: Self-pay | Admitting: Emergency Medicine

## 2019-01-17 ENCOUNTER — Other Ambulatory Visit: Payer: Self-pay

## 2019-01-17 ENCOUNTER — Emergency Department
Admission: EM | Admit: 2019-01-17 | Discharge: 2019-01-17 | Disposition: A | Discharge status: Home / Self Care | Payer: Self-pay | Attending: Student in an Organized Health Care Education/Training Program | Admitting: Student in an Organized Health Care Education/Training Program

## 2019-01-17 DIAGNOSIS — F17210 Nicotine dependence, cigarettes, uncomplicated: Secondary | ICD-10-CM | POA: Insufficient documentation

## 2019-01-17 DIAGNOSIS — K0889 Other specified disorders of teeth and supporting structures: Secondary | ICD-10-CM | POA: Insufficient documentation

## 2019-01-17 DIAGNOSIS — K0381 Cracked tooth: Secondary | ICD-10-CM | POA: Insufficient documentation

## 2019-01-17 MED ORDER — TRAMADOL HCL 50 MG PO TABS
50.0000 mg | ORAL_TABLET | Freq: Once | ORAL | Status: AC
Start: 2019-01-17 — End: 2019-01-17
  Administered 2019-01-17: 50 mg via ORAL
  Filled 2019-01-17: qty 1

## 2019-01-17 MED ORDER — AMOXICILLIN-POT CLAVULANATE 875-125 MG PO TABS: 1.0000 | ORAL_TABLET | Freq: Two times a day (BID) | ORAL | 0 refills | Status: AC

## 2019-01-17 MED ORDER — TRAMADOL HCL 50 MG PO TABS: 50.0000 mg | ORAL_TABLET | Freq: Four times a day (QID) | ORAL | 0 refills | Status: AC | PRN

## 2019-01-17 NOTE — ED Provider Notes (Signed)
[facility] Emergency Department Provider Note  ____________________________________________  Time seen: Approximately 8:26 PM  I have reviewed the triage vital signs and the nursing notes.   HISTORY  Chief Complaint Dental Pain    HPI Bryan Marks is a 28 y.o. male presents to the emergency department with pain from a broken inferior 30.  Patient reports that he has experienced 10 out of 10 dental pain for the past 4 days.  He has noticed some mild swelling along the right lower jaw.  Patient sought care and emergency department several weeks ago with similar complaints and was given antibiotic and tramadol.  Patient reports that his pain improved and then worsened again.  He has an appointment at a local dentist office on Monday but states that he is concerned as he has limited funds available for dental care and has already paid $135 for dental x-rays.  Patient is tearful on exam.  He has no pain underneath the tongue and neck swelling.  He denies trouble swallowing or changes in voice.  No other alleviating measures have been attempted.        History reviewed. No pertinent past medical history.  There are no active problems to display for this patient.   Past Surgical History:  Procedure Laterality Date  . FEMUR FRACTURE SURGERY     rod was placed  . PELVIC FRACTURE SURGERY      Prior to Admission medications   Medication Sig Start Date End Date Taking? Authorizing Provider  amoxicillin-clavulanate (AUGMENTIN) 875-125 MG tablet Take 1 tablet by mouth 2 (two) times daily for 10 days. 01/17/19 01/27/19  Orvil FeilWoods, Jaclyn M, PA-C  cephALEXin (KEFLEX) 500 MG capsule Take 1 capsule (500 mg total) by mouth 4 (four) times daily. 05/01/18   Cuthriell, Delorise RoyalsJonathan D, PA-C  hydrocortisone (ANUSOL-HC) 25 MG suppository Place 1 suppository (25 mg total) rectally 2 (two) times daily. 11/14/17   Fisher, Roselyn BeringSusan W, PA-C  oxyCODONE-acetaminophen (PERCOCET) 5-325 MG tablet Take 1  tablet by mouth every 4 (four) hours as needed for moderate pain or severe pain. 03/09/18 03/09/19  Schaevitz, Myra Rudeavid Matthew, MD  traMADol (ULTRAM) 50 MG tablet Take 1 tablet (50 mg total) by mouth every 6 (six) hours as needed for up to 3 days. 01/17/19 01/20/19  Orvil FeilWoods, Jaclyn M, PA-C    Allergies Patient has no known allergies.  No family history on file.  Social History Social History   Tobacco Use  . Smoking status: Current Every Day Smoker    Packs/day: 0.50    Types: Cigarettes  . Smokeless tobacco: Never Used  Substance Use Topics  . Alcohol use: Yes    Comment: occ  . Drug use: No     Review of Systems  Constitutional: No fever/chills Eyes: No visual changes. No discharge ENT: Patient has broken Inferior 30.  Cardiovascular: no chest pain. Respiratory: no cough. No SOB. Gastrointestinal: No abdominal pain.  No nausea, no vomiting.  No diarrhea.  No constipation. Genitourinary: Negative for dysuria. No hematuria Musculoskeletal: Negative for musculoskeletal pain. Skin: Negative for rash, abrasions, lacerations, ecchymosis. Neurological: Negative for headaches, focal weakness or numbness.   ____________________________________________   PHYSICAL EXAM:  VITAL SIGNS: ED Triage Vitals  Enc Vitals Group     BP 01/17/19 1903 (!) 157/103     Pulse Rate 01/17/19 1903 68     Resp 01/17/19 1903 18     Temp 01/17/19 1903 98.1 F (36.7 C)     Temp Source 01/17/19 1903 Oral  SpO2 01/17/19 1903 96 %     Weight 01/17/19 1858 190 lb (86.2 kg)     Height 01/17/19 1858 6\' 1"  (1.854 m)     Head Circumference --      Peak Flow --      Pain Score 01/17/19 1858 10     Pain Loc --      Pain Edu? --      Excl. in GC? --      Constitutional: Alert and oriented. Well appearing and in no acute distress. Eyes: Conjunctivae are normal. PERRL. EOMI. Head: Atraumatic. ENT:      Ears: TMs are pearly.      Nose: No congestion/rhinnorhea.      Mouth/Throat: Mucous membranes  are moist.  Overall dentition is healthy.  Inferior 30 is broken and affected by dental caries.  No pain underneath the tongue. Neck: No stridor.  No cervical spine tenderness to palpation.  No neck swelling. Cardiovascular: Normal rate, regular rhythm. Normal S1 and S2.  Good peripheral circulation. Respiratory: Normal respiratory effort without tachypnea or retractions. Lungs CTAB. Good air entry to the bases with no decreased or absent breath sounds.  Skin:  Skin is warm, dry and intact. No rash noted. Psychiatric: Mood and affect are normal. Speech and behavior are normal. Patient exhibits appropriate insight and judgement.   ____________________________________________   LABS (all labs ordered are listed, but only abnormal results are displayed)  Labs Reviewed - No data to display ____________________________________________  EKG   ____________________________________________  RADIOLOGY   No results found.  ____________________________________________    PROCEDURES  Procedure(s) performed:    Procedures    Medications  traMADol (ULTRAM) tablet 50 mg (50 mg Oral Given 01/17/19 2014)     ____________________________________________   INITIAL IMPRESSION / ASSESSMENT AND PLAN / ED COURSE  Pertinent labs & imaging results that were available during my care of the patient were reviewed by me and considered in my medical decision making (see chart for details).  Review of the [place] CSRS was performed in accordance of the [facility] prior to dispensing any controlled drugs.         Assessment and plan Dental pain Patient presents to the emergency department with concern for dental pain.  On physical exam, inferior 30 was broken.  Patient was given tramadol in the emergency department for pain.  He was discharged with a short course of tramadol and amoxicillin.  He was advised to keep appointment with local dentist.  Additional dental resources were provided in patient's  discharge paperwork.  All patient questions were answered.  ____________________________________________  FINAL CLINICAL IMPRESSION(S) / ED DIAGNOSES  Final diagnoses:  Pain, dental      NEW MEDICATIONS STARTED DURING THIS VISIT:  ED Discharge Orders         Ordered    traMADol (ULTRAM) 50 MG tablet  Every 6 hours PRN     01/17/19 2007    amoxicillin-clavulanate (AUGMENTIN) 875-125 MG tablet  2 times daily     01/17/19 2007              This chart was dictated using voice recognition software/Dragon. Despite best efforts to proofread, errors can occur which can change the meaning. Any change was purely unintentional.    Gasper Lloyd 01/17/19 2032    Willy Eddy, MD 01/17/19 2103

## 2019-01-17 NOTE — ED Triage Notes (Signed)
Pt arrives with complaints of dental pain. Pt reports known dental issues but reports lack of financial ability to seek dental care.

## 2019-01-17 NOTE — Discharge Instructions (Signed)
OPTIONS FOR DENTAL FOLLOW UP CARE ° °[place] Department of Health and Human Services - Local Safety Net Dental Clinics °http://www.ncdhhs.gov/dph/oralhealth/services/safetynetclinics.htm °  °[facility] (336-562-3123) ° °[facility] [place] ([phone]) ° °[facility] ([phone] ext [phone]) ° °[facility] ([phone]) ° °[facility] ([phone]) °This clinic caters to the indigent population and is on a lottery system. °Location: °[facility], [address], [place] °Clinic Hours: °Wednesdays from 6pm - 9pm, patients seen by a lottery system. °For dates, call or go to [email or web address].[facility].edu/[facility]ac/patients/Dental-[facility] °Services: °Cleanings, fillings and simple extractions. °Payment Options: °DENTAL WORK IS FREE OF CHARGE. Bring proof of income or support. °Best way to get seen: °Arrive at 5:15 pm - this is a lottery, NOT first come/first serve, so arriving earlier will not increase your chances of being seen. °  °  °[facility] °[phone] °Select option 1 for emergencies °  °Location: °[facility], [address], [place] °Clinic Hours: °No walk-ins accepted - call the day before to schedule an appointment. °Check in times are 9:30 am and 1:30 pm. °Services: °Simple extractions, temporary fillings, pulpectomy/pulp debridement, uncomplicated abscess drainage. °Payment Options: °PAYMENT IS DUE AT THE TIME OF SERVICE.  Fee is usually $100-200, additional surgical procedures (e.g. abscess drainage) may be extra. °Cash, checks, Visa/MasterCard accepted.  Can file Medicaid if patient is covered for dental - patient should call case worker to check. °No discount for [facility] patients. °Best way to get seen: °MUST call the day before and get onto the schedule. Can usually be seen the next 1-2 days. No walk-ins accepted. °  °  °[facility] °[phone] °   °Location: °[facility], [address], [place] °Clinic Hours: °M, W, Th, F 8am or 1:30pm, Tues 9a or 1:30 - first come/first served. °Services: °Simple extractions, temporary fillings, uncomplicated abscess drainage.  You do not need to be an [place] resident. °Payment Options: °PAYMENT IS DUE AT THE TIME OF SERVICE. °Dental insurance, otherwise sliding scale - bring proof of income or support. °Depending on income and treatment needed, cost is usually $50-200. °Best way to get seen: °Arrive early as it is first come/first served. °  °  °[facility] °[phone] °  °Location: °[address] °Clinic Hours: °Mon-Thu 8a-5p °Services: °Most basic dental services including extractions and fillings. °Payment Options: °PAYMENT IS DUE AT THE TIME OF SERVICE. °Sliding scale, up to 50% off - bring proof if income or support. °Medicaid with dental option accepted. °Best way to get seen: °Call to schedule an appointment, can usually be seen within 2 weeks OR they will try to see walk-ins - show up at 8a or 2p (you may have to wait). °  °  °[facility] °[phone] °[place] RESIDENTS ONLY °  °Location: °[facility], [address], [place], [place] [address] °Clinic Hours: By appointment only. °Monday - Thursday 8am-5pm, Friday 8am-12pm °Services: Cleanings, fillings, extractions. °Payment Options: °PAYMENT IS DUE AT THE TIME OF SERVICE. °Cash, Visa or MasterCard. Sliding scale - $30 minimum per service. °Best way to get seen: °Come in to office, complete packet and make an appointment - need proof of income °or support monies for each household member and proof of [place] residence. °Usually takes about a month to get in. °  °  °[facility] °[phone] °  °Location: °[address]   [place] °Clinic Hours: Walk-in Urgent Care Dental Services are offered Monday-Friday  mornings only. °The numbers of emergencies accepted daily is limited to the number of °providers available. °Maximum 15 - Mondays, Wednesdays & Thursdays °Maximum 10 - Tuesdays & Fridays °Services: °You do not need to be a [place] resident to be seen for a dental emergency. °Emergencies are defined as pain, swelling, abnormal bleeding, or dental trauma. Walkins will receive x-rays if needed. °NOTE: Dental cleaning is not an emergency. °Payment Options: °PAYMENT IS DUE AT THE TIME OF SERVICE. °Minimum co-pay is $40.00 for uninsured patients. °Minimum co-pay is $3.00 for Medicaid with dental coverage. °Dental Insurance is accepted and must be presented at time of visit. °Medicare does not cover dental. °Forms of payment: Cash, credit card, checks. °Best way to get seen: °If not previously registered with the clinic, walk-in dental registration begins at 7:15 am and is on a first come/first serve basis. °If previously registered with the clinic, call to make an appointment. °  °  °[facility] °[phone] °[place] RESIDENTS ONLY °  °Location: °[address], [place], [place] °Clinic Hours: °Mon-Thu 10a-2p °Services: Extractions only! °Payment Options: °FREE (donations accepted) - bring proof of income or support °Best way to get seen: °Call and schedule an appointment OR come at 8am on the 1st Monday of every month (except for holidays) when it is first come/first served. °  °  °[facility] °[phone] °  °Location: °[address], [place] °Clinic Hours: °Friday mornings °Services, Payment Options, Best way to get seen: °Call for info °

## 2019-12-13 ENCOUNTER — Emergency Department: Payer: Self-pay

## 2019-12-13 ENCOUNTER — Emergency Department
Admission: EM | Admit: 2019-12-13 | Discharge: 2019-12-13 | Disposition: A | Discharge status: Home / Self Care | Payer: Self-pay | Attending: Emergency Medicine | Admitting: Emergency Medicine

## 2019-12-13 ENCOUNTER — Encounter: Payer: Self-pay | Admitting: Emergency Medicine

## 2019-12-13 ENCOUNTER — Other Ambulatory Visit: Payer: Self-pay

## 2019-12-13 DIAGNOSIS — J069 Acute upper respiratory infection, unspecified: Secondary | ICD-10-CM | POA: Insufficient documentation

## 2019-12-13 DIAGNOSIS — F17210 Nicotine dependence, cigarettes, uncomplicated: Secondary | ICD-10-CM | POA: Insufficient documentation

## 2019-12-13 DIAGNOSIS — Z20822 Contact with and (suspected) exposure to covid-19: Secondary | ICD-10-CM | POA: Insufficient documentation

## 2019-12-13 MED ORDER — AZITHROMYCIN 250 MG PO TABS: ORAL_TABLET | ORAL | 0 refills | Status: DC

## 2019-12-13 NOTE — ED Provider Notes (Signed)
[facility] Emergency Department Provider Note  ____________________________________________  Time seen: Approximately 2:52 PM  I have reviewed the triage vital signs and the nursing notes.   HISTORY  Chief Complaint URI    HPI Bryan Marks is a 29 y.o. male that presents to the emergency department for evaluation of feeling warm, sore throat, nasal congestion, productive cough with green sputum for 1 week.  No sick contacts.  Patient has not checked his temperature but has felt warm.  Patient smokes cigarettes daily.  No shortness of breath, chest pain, vomiting, abdominal pain, diarrhea.  History reviewed. No pertinent past medical history.  There are no problems to display for this patient.   Past Surgical History:  Procedure Laterality Date  . FEMUR FRACTURE SURGERY     rod was placed  . PELVIC FRACTURE SURGERY      Prior to Admission medications   Medication Sig Start Date End Date Taking? Authorizing Provider  azithromycin (ZITHROMAX Z-PAK) 250 MG tablet Take 2 tablets (500 mg) on  Day 1,  followed by 1 tablet (250 mg) once daily on Days 2 through 5. 12/13/19   Laban Emperor, PA-C    Allergies Patient has no known allergies.  No family history on file.  Social History Social History   Tobacco Use  . Smoking status: Current Every Day Smoker    Packs/day: 0.50    Types: Cigarettes  . Smokeless tobacco: Never Used  Substance Use Topics  . Alcohol use: Yes    Comment: occ  . Drug use: No     Review of Systems  Constitutional: No chills Eyes: No visual changes. No discharge. ENT: Positive for congestion and rhinorrhea. Cardiovascular: No chest pain. Respiratory: Positive for cough. No SOB. Gastrointestinal: No abdominal pain.  No nausea, no vomiting.  No diarrhea.  No constipation. Musculoskeletal: Negative for musculoskeletal pain. Skin: Negative for rash, abrasions, lacerations, ecchymosis. Neurological: Negative for  headaches.   ____________________________________________   PHYSICAL EXAM:  VITAL SIGNS: ED Triage Vitals  Enc Vitals Group     BP 12/13/19 1422 127/72     Pulse Rate 12/13/19 1422 68     Resp 12/13/19 1422 18     Temp 12/13/19 1422 98 F (36.7 C)     Temp Source 12/13/19 1422 Oral     SpO2 12/13/19 1422 96 %     Weight 12/13/19 1423 190 lb (86.2 kg)     Height 12/13/19 1423 6\' 1"  (1.854 m)     Head Circumference --      Peak Flow --      Pain Score 12/13/19 1422 0     Pain Loc --      Pain Edu? --      Excl. in [place]? --      Constitutional: Alert and oriented. Well appearing and in no acute distress. Eyes: Conjunctivae are normal. PERRL. EOMI. No discharge. Head: Atraumatic. ENT: No frontal and maxillary sinus tenderness.      Ears: Tympanic membranes pearly gray with good landmarks. No discharge.      Nose: Mild congestion/rhinnorhea.      Mouth/Throat: Mucous membranes are moist. Oropharynx non-erythematous. Tonsils not enlarged. No exudates. Uvula midline. Neck: No stridor.   Hematological/Lymphatic/Immunilogical: No cervical lymphadenopathy. Cardiovascular: Normal rate, regular rhythm.  Good peripheral circulation. Respiratory: Normal respiratory effort without tachypnea or retractions. Lungs CTAB. Good air entry to the bases with no decreased or absent breath sounds. Gastrointestinal: Bowel sounds 4 quadrants. Soft and nontender to palpation.  No guarding or rigidity. No palpable masses. No distention. Musculoskeletal: Full range of motion to all extremities. No gross deformities appreciated. Neurologic:  Normal speech and language. No gross focal neurologic deficits are appreciated.  Skin:  Skin is warm, dry and intact. No rash noted. Psychiatric: Mood and affect are normal. Speech and behavior are normal. Patient exhibits appropriate insight and judgement.   ____________________________________________   LABS (all labs ordered are listed, but only abnormal  results are displayed)  Labs Reviewed  SARS CORONAVIRUS 2 (TAT 6-24 HRS)   ____________________________________________  EKG   ____________________________________________  RADIOLOGY Lexine Baton, personally viewed and evaluated these images (plain radiographs) as part of my medical decision making, as well as reviewing the written report by the radiologist.  DG Chest 1 View  Result Date: 12/13/2019 CLINICAL DATA:  Scratch E throat runny nose.  Cough and congestion. EXAM: CHEST  1 VIEW COMPARISON:  No comparison studies available. FINDINGS: The lungs are clear without focal pneumonia, edema, pneumothorax or pleural effusion. The cardiopericardial silhouette is within normal limits for size. The visualized bony structures of the thorax are intact. IMPRESSION: No active disease. Electronically Signed   By: Kennith Center M.D.   On: 12/13/2019 14:46    ____________________________________________    PROCEDURES  Procedure(s) performed:    Procedures    Medications - No data to display   ____________________________________________   INITIAL IMPRESSION / ASSESSMENT AND PLAN / ED COURSE  Pertinent labs & imaging results that were available during my care of the patient were reviewed by me and considered in my medical decision making (see chart for details).  Review of the  CSRS was performed in accordance of the [facility] prior to dispensing any controlled drugs.   Patient's diagnosis is consistent with URI. Vital signs and exam are reassuring.  Chest x-ray negative for acute cardiopulmonary processes. Covid test is pending. Patient appears well and is staying well hydrated. Patient feels comfortable going home. Patient will be discharged home with prescriptions for azithromycin. Patient is to follow up with primary care as needed or otherwise directed. Patient is given ED precautions to return to the ED for any worsening or new symptoms.   Bryan Marks was evaluated in  Emergency Department on 12/13/2019 for the symptoms described in the history of present illness. He was evaluated in the context of the global COVID-19 pandemic, which necessitated consideration that the patient might be at risk for infection with the SARS-CoV-2 virus that causes COVID-19. Institutional protocols and algorithms that pertain to the evaluation of patients at risk for COVID-19 are in a state of rapid change based on information released by regulatory bodies including the CDC and federal and state organizations. These policies and algorithms were followed during the patient's care in the ED.  ____________________________________________  FINAL CLINICAL IMPRESSION(S) / ED DIAGNOSES  Final diagnoses:  Upper respiratory tract infection, unspecified type      NEW MEDICATIONS STARTED DURING THIS VISIT:  ED Discharge Orders         Ordered    azithromycin (ZITHROMAX Z-PAK) 250 MG tablet     12/13/19 1508              This chart was dictated using voice recognition software/Dragon. Despite best efforts to proofread, errors can occur which can change the meaning. Any change was purely unintentional.    Enid Derry, PA-C 12/13/19 1553    Chesley Noon, MD 12/18/19 [phone]

## 2019-12-13 NOTE — ED Triage Notes (Signed)
Presents with scratchy throat,runny nose cough and congestion  States sx's started about 1 week ago  Denies any fever

## 2019-12-14 LAB — SARS CORONAVIRUS 2 (TAT 6-24 HRS): SARS Coronavirus 2: NEGATIVE

## 2020-01-06 ENCOUNTER — Emergency Department: Payer: Self-pay

## 2020-01-06 ENCOUNTER — Observation Stay
Admission: EM | Admit: 2020-01-06 | Discharge: 2020-01-06 | Discharge status: Against Medical Advice | Payer: Self-pay | Attending: Internal Medicine | Admitting: Internal Medicine

## 2020-01-06 ENCOUNTER — Other Ambulatory Visit: Payer: Self-pay

## 2020-01-06 DIAGNOSIS — F17210 Nicotine dependence, cigarettes, uncomplicated: Secondary | ICD-10-CM | POA: Insufficient documentation

## 2020-01-06 DIAGNOSIS — R0689 Other abnormalities of breathing: Secondary | ICD-10-CM

## 2020-01-06 DIAGNOSIS — Z720 Tobacco use: Secondary | ICD-10-CM | POA: Diagnosis present

## 2020-01-06 DIAGNOSIS — Z5321 Procedure and treatment not carried out due to patient leaving prior to being seen by health care provider: Secondary | ICD-10-CM | POA: Insufficient documentation

## 2020-01-06 DIAGNOSIS — I952 Hypotension due to drugs: Secondary | ICD-10-CM

## 2020-01-06 DIAGNOSIS — R9431 Abnormal electrocardiogram [ECG] [EKG]: Secondary | ICD-10-CM | POA: Diagnosis present

## 2020-01-06 DIAGNOSIS — T50901A Poisoning by unspecified drugs, medicaments and biological substances, accidental (unintentional), initial encounter: Principal | ICD-10-CM | POA: Diagnosis present

## 2020-01-06 DIAGNOSIS — Z20822 Contact with and (suspected) exposure to covid-19: Secondary | ICD-10-CM | POA: Insufficient documentation

## 2020-01-06 DIAGNOSIS — T40605A Adverse effect of unspecified narcotics, initial encounter: Secondary | ICD-10-CM

## 2020-01-06 HISTORY — DX: Tobacco use: Z72.0

## 2020-01-06 LAB — CBC WITH DIFFERENTIAL/PLATELET
Abs Immature Granulocytes: 0.03 10*3/uL (ref 0.00–0.07)
Basophils Absolute: 0.1 10*3/uL (ref 0.0–0.1)
Basophils Relative: 1 %
Eosinophils Absolute: 0.1 10*3/uL (ref 0.0–0.5)
Eosinophils Relative: 1 %
HCT: 48.4 % (ref 39.0–52.0)
Hemoglobin: 16.3 g/dL (ref 13.0–17.0)
Immature Granulocytes: 0 %
Lymphocytes Relative: 29 %
Lymphs Abs: 2.4 10*3/uL (ref 0.7–4.0)
MCH: 29.1 pg (ref 26.0–34.0)
MCHC: 33.7 g/dL (ref 30.0–36.0)
MCV: 86.4 fL (ref 80.0–100.0)
Monocytes Absolute: 0.6 10*3/uL (ref 0.1–1.0)
Monocytes Relative: 7 %
Neutro Abs: 5.2 10*3/uL (ref 1.7–7.7)
Neutrophils Relative %: 62 %
Platelets: 292 10*3/uL (ref 150–400)
RBC: 5.6 MIL/uL (ref 4.22–5.81)
RDW: 12.3 % (ref 11.5–15.5)
WBC: 8.4 10*3/uL (ref 4.0–10.5)
nRBC: 0 % (ref 0.0–0.2)

## 2020-01-06 LAB — COMPREHENSIVE METABOLIC PANEL
ALT: 26 U/L (ref 0–44)
AST: 41 U/L (ref 15–41)
Albumin: 4.8 g/dL (ref 3.5–5.0)
Alkaline Phosphatase: 88 U/L (ref 38–126)
Anion gap: 9 (ref 5–15)
BUN: 17 mg/dL (ref 6–20)
CO2: 29 mmol/L (ref 22–32)
Calcium: 9.4 mg/dL (ref 8.9–10.3)
Chloride: 102 mmol/L (ref 98–111)
Creatinine, Ser: 1.12 mg/dL (ref 0.61–1.24)
GFR calc Af Amer: >60 mL/min (ref >60)
GFR calc non Af Amer: >60 mL/min (ref >60)
Glucose, Bld: 120 mg/dL — ABNORMAL HIGH (ref 70–99)
Potassium: 3.9 mmol/L (ref 3.5–5.1)
Sodium: 140 mmol/L (ref 135–145)
Total Bilirubin: 0.9 mg/dL (ref 0.3–1.2)
Total Protein: 7.5 g/dL (ref 6.5–8.1)

## 2020-01-06 LAB — URINE DRUG SCREEN, QUALITATIVE (ARMC ONLY)
Amphetamines, Ur Screen: POSITIVE — AB
Barbiturates, Ur Screen: NOT DETECTED
Benzodiazepine, Ur Scrn: POSITIVE — AB
Cannabinoid 50 Ng, Ur ~~LOC~~: POSITIVE — AB
Cocaine Metabolite,Ur ~~LOC~~: POSITIVE — AB
MDMA (Ecstasy)Ur Screen: NOT DETECTED
Methadone Scn, Ur: NOT DETECTED
Opiate, Ur Screen: NOT DETECTED
Phencyclidine (PCP) Ur S: NOT DETECTED
Tricyclic, Ur Screen: NOT DETECTED

## 2020-01-06 LAB — MRSA PCR SCREENING: MRSA by PCR: NEGATIVE

## 2020-01-06 LAB — RESPIRATORY PANEL BY RT PCR (FLU A&B, COVID)
Influenza A by PCR: NEGATIVE
Influenza B by PCR: NEGATIVE
SARS Coronavirus 2 by RT PCR: NEGATIVE

## 2020-01-06 LAB — ACETAMINOPHEN LEVEL: Acetaminophen (Tylenol), Serum: <10 ug/mL — ABNORMAL LOW (ref 10–30)

## 2020-01-06 LAB — TROPONIN I (HIGH SENSITIVITY)
Troponin I (High Sensitivity): 13 ng/L (ref <18)
Troponin I (High Sensitivity): 5 ng/L (ref <18)

## 2020-01-06 LAB — ETHANOL: Alcohol, Ethyl (B): <10 mg/dL (ref <10)

## 2020-01-06 LAB — SARS CORONAVIRUS 2 (TAT 6-24 HRS): SARS Coronavirus 2: NEGATIVE

## 2020-01-06 LAB — SALICYLATE LEVEL: Salicylate Lvl: <7 mg/dL — ABNORMAL LOW (ref 7.0–30.0)

## 2020-01-06 LAB — HIV ANTIBODY (ROUTINE TESTING W REFLEX): HIV Screen 4th Generation wRfx: NONREACTIVE

## 2020-01-06 MED ORDER — NICOTINE 21 MG/24HR TD PT24
21.0000 mg | MEDICATED_PATCH | Freq: Every day | TRANSDERMAL | Status: DC
  Administered 2020-01-06: 21 mg via TRANSDERMAL
  Filled 2020-01-06: qty 1

## 2020-01-06 MED ORDER — CHLORHEXIDINE GLUCONATE CLOTH 2 % EX PADS
6.0000 | MEDICATED_PAD | Freq: Every day | CUTANEOUS | Status: DC
  Administered 2020-01-06: 6 via TOPICAL

## 2020-01-06 MED ORDER — SODIUM CHLORIDE 0.9 % IV SOLN: INTRAVENOUS | Status: DC

## 2020-01-06 MED ORDER — LACTATED RINGERS IV SOLN: INTRAVENOUS | Status: DC

## 2020-01-06 MED ORDER — DM-GUAIFENESIN ER 30-600 MG PO TB12
1.0000 | ORAL_TABLET | Freq: Two times a day (BID) | ORAL | Status: DC
  Administered 2020-01-06: 1 via ORAL
  Filled 2020-01-06: qty 1

## 2020-01-06 MED ORDER — NOREPINEPHRINE 4 MG/250ML-% IV SOLN: 0.0000 ug/min | INTRAVENOUS | Status: DC

## 2020-01-06 MED ORDER — LACTATED RINGERS IV BOLUS
1000.0000 mL | Freq: Once | INTRAVENOUS | Status: AC
  Administered 2020-01-06: 01:00:00 1000 mL via INTRAVENOUS

## 2020-01-06 MED ORDER — ALBUTEROL SULFATE (2.5 MG/3ML) 0.083% IN NEBU: 3.0000 mL | INHALATION_SOLUTION | RESPIRATORY_TRACT | Status: DC | PRN

## 2020-01-06 MED ORDER — ONDANSETRON HCL 4 MG/2ML IJ SOLN: 4.0000 mg | Freq: Three times a day (TID) | INTRAMUSCULAR | Status: DC | PRN

## 2020-01-06 MED ORDER — LACTATED RINGERS IV BOLUS
1000.0000 mL | Freq: Once | INTRAVENOUS | Status: AC
  Administered 2020-01-06: 1000 mL via INTRAVENOUS

## 2020-01-06 MED ORDER — ENOXAPARIN SODIUM 40 MG/0.4ML ~~LOC~~ SOLN: 40.0000 mg | SUBCUTANEOUS | Status: DC

## 2020-01-06 NOTE — ED Notes (Signed)
Pt covid swabbed and walked to lab. 
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
## 2020-01-06 NOTE — ED Notes (Signed)
Pt has stopped shivering. Bearhugger removed form pt and new warm blanket given.
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
## 2020-01-06 NOTE — H&P (Signed)
History and Physical    Bryan Marks NLG:[id number] DOB: December 22, 1990 DOA: 01/06/2020  Referring MD/NP/PA:   PCP: Patient, No Pcp Per   Patient coming from:  The patient is coming from home.  At baseline, pt is independent for most of ADL.        Chief Complaint: drug overdose  HPI: Bryan Marks is a 29 y.o. male with medical history significant of tobacco abuse, who presents with drug overdose.  Per report, EMS was called to the patient's friend's house after patient became unresponsive due to accidental drug overdose. When EMS arrived patient was having agonal respirations.  Patient's friend had a BVM that he was using on room air and had already given 2 mg of intranasal Narcan.  After 6 of narcan in total was given, pt woke up. No CPR was administered. Patient never lost pulses. Patient reports taking 2 xanax to EDP, but denies any other drug use. Patient denies suicidal ideation and reports that the overdose was accidental.Patient denies chest pain, shortness breath, cough.  No nausea vomiting, diarrhea, abdominal pain, symptoms of UTI or unilateral weakness.  Pt was initially hypotensive with BP 77/44 after 3L of LR is given. Currently Bp is 93/58 when I saw pt in ED. NP from ICU, Weldon Picking evaluated pt and recommended to admit patient to stepdown and continuation of IV fluid resuscitation.  ED Course: pt was found to have WBC 8.4, troponin 4 -->13, alcohol level less than 10, pending Covid PCR, electrolytes renal function okay, temperature normal, heart rate 51-82, oxygen saturation 96% on room air chest x-ray negative. Pt is placed to SDU for obs.   Review of Systems:   General: no fevers, chills, no body weight gain, has fatigue HEENT: no blurry vision, hearing changes or sore throat Respiratory: no dyspnea, coughing, wheezing CV: no chest pain, no palpitations GI: no nausea, vomiting, abdominal pain, diarrhea, constipation GU: no dysuria, burning on urination, increased  urinary frequency, hematuria  Ext: no leg edema Neuro: no unilateral weakness, numbness, or tingling, no vision change or hearing loss. Has unresponsiveness Skin: no rash, no skin tear. MSK: No muscle spasm, no deformity, no limitation of range of movement in spin Heme: No easy bruising.  Travel history: No recent long distant travel.  Allergy: No Known Allergies  Past Medical History:  Diagnosis Date  . Tobacco abuse     Past Surgical History:  Procedure Laterality Date  . FEMUR FRACTURE SURGERY     rod was placed  . PELVIC FRACTURE SURGERY      Social History:  reports that he has been smoking cigarettes. He has been smoking about 0.50 packs per day. He has never used smokeless tobacco. He reports current alcohol use. He reports current drug use.  Family History: tried to review with pt, but pt state that " I do not know".   Prior to Admission medications   Not on File    Physical Exam: Vitals:   01/06/20 0600 01/06/20 0630 01/06/20 0700 01/06/20 0740  BP: 97/68 (!) 93/58 91/61 125/70  Pulse: (!) 51 (!) 51 (!) 43 (!) 46  Resp: 18  14 15   Temp:      TempSrc:      SpO2: 100% 99% 98% 100%  Weight:      Height:       General: Not in acute distress HEENT:       Eyes: PERRL, EOMI, no scleral icterus.       ENT: No discharge from the  ears and nose, no pharynx injection, no tonsillar enlargement.        Neck: No JVD, no bruit, no mass felt. Heme: No neck lymph node enlargement. Cardiac: S1/S2, RRR, No murmurs, No gallops or rubs. Respiratory: No rales, wheezing, rhonchi or rubs. GI: Soft, nondistended, nontender, no rebound pain, no organomegaly, BS present. GU: No hematuria Ext: No pitting leg edema bilaterally. 2+DP/PT pulse bilaterally. Musculoskeletal: No joint deformities, No joint redness or warmth, no limitation of ROM in spin. Skin: No rashes.  Neuro: pt is drowsy, but is oriented X3, answered all questions appropriately.  Cranial nerves II-XII grossly intact,  moves all extremities normally.  Psych: Patient is not psychotic, no suicidal or hemocidal ideation.  Labs on Admission: I have personally reviewed following labs and imaging studies  CBC: Recent Labs  Lab 01/06/20 0032  WBC 8.4  NEUTROABS 5.2  HGB 16.3  HCT 48.4  MCV 86.4  PLT 292   Basic Metabolic Panel: Recent Labs  Lab 01/06/20 0032  NA 140  K 3.9  CL 102  CO2 29  GLUCOSE 120*  BUN 17  CREATININE 1.12  CALCIUM 9.4   GFR: Estimated Creatinine Clearance: 110 mL/min (by C-G formula based on SCr of 1.12 mg/dL). Liver Function Tests: Recent Labs  Lab 01/06/20 0032  AST 41  ALT 26  ALKPHOS 88  BILITOT 0.9  PROT 7.5  ALBUMIN 4.8   No results for input(s): LIPASE, AMYLASE in the last 168 hours. No results for input(s): AMMONIA in the last 168 hours. Coagulation Profile: No results for input(s): INR, PROTIME in the last 168 hours. Cardiac Enzymes: No results for input(s): CKTOTAL, CKMB, CKMBINDEX, TROPONINI in the last 168 hours. BNP (last 3 results) No results for input(s): PROBNP in the last 8760 hours. HbA1C: No results for input(s): HGBA1C in the last 72 hours. CBG: No results for input(s): GLUCAP in the last 168 hours. Lipid Profile: No results for input(s): CHOL, HDL, LDLCALC, TRIG, CHOLHDL, LDLDIRECT in the last 72 hours. Thyroid Function Tests: No results for input(s): TSH, T4TOTAL, FREET4, T3FREE, THYROIDAB in the last 72 hours. Anemia Panel: No results for input(s): VITAMINB12, FOLATE, FERRITIN, TIBC, IRON, RETICCTPCT in the last 72 hours. Urine analysis:    Component Value Date/Time   COLORURINE YELLOW (A) 05/09/2018 1508   APPEARANCEUR CLOUDY (A) 05/09/2018 1508   LABSPEC 1.026 05/09/2018 1508   PHURINE 7.0 05/09/2018 1508   GLUCOSEU NEGATIVE 05/09/2018 1508   HGBUR LARGE (A) 05/09/2018 1508   BILIRUBINUR NEGATIVE 05/09/2018 1508   KETONESUR NEGATIVE 05/09/2018 1508   PROTEINUR 100 (A) 05/09/2018 1508   UROBILINOGEN 2.0 (H) 04/15/2015  1233   NITRITE NEGATIVE 05/09/2018 1508   LEUKOCYTESUR MODERATE (A) 05/09/2018 1508   Sepsis Labs: @LABRCNTIP (procalcitonin:4,lacticidven:4) )No results found for this or any previous visit (from the past 240 hour(s)).   Radiological Exams on Admission: DG Chest Portable 1 View  Result Date: 01/06/2020 CLINICAL DATA:  Overdose EXAM: PORTABLE CHEST 1 VIEW COMPARISON:  12/13/2019 FINDINGS: The heart size and mediastinal contours are within normal limits. Both lungs are clear. The visualized skeletal structures are unremarkable. IMPRESSION: No active disease. Electronically Signed   By: 12/15/2019 M.D.   On: 01/06/2020 01:03     EKG: Independently reviewed.  Sinus rhythm, QTC 440, T wave inversion only in lead III.  Has diffused ST elevation which is likely due to "benign early repolarization" per cardiology Dr. 03/07/2020.    Assessment/Plan Principal Problem:   Drug overdose Active Problems:  Tobacco abuse   Abnormal EKG   Hypotension due to drugs   Drug overdose and hypotension due to drugs: Blood pressure responded to IV fluid resuscitation.  Initial blood pressure was 77/44, which improved to 93/58 after 3L of LR is given. NP from ICU, Guy Begin evaluated pt and recommended to admit patient to stepdown and continuation of IV fluid resuscitation.  -Place to SDU for obs -Monitor blood pressure closely -IVF: 3L of LR, then 125 cc/h -Frequent neuro check -Check Tylenol level and salicylate level - f/u UDS  Tobacco abuse: -Nicotine patch  Abnormal EKG: No CP or SOB. Trop 5  -->13. EKG showed diffuse ST elevations, but ST segment seems concave up. EDP "discussed with cardiology Dr. Azucena Cecil who evaluated patient's EKGs and delta troponin and believe the findings are consistent with benign early repolarization". Pt dose not have any chest pain -cardiac monitoring now   DVT ppx: SQ Lovenox Code Status: Full code Family Communication: not done, no family member is at  bed side.    Disposition Plan:  Anticipate discharge back to previous home environment Consults called:  ICU, NP, Jeremia Knee Admission status:   SDU/obs     Date of Service 01/06/2020    Lorretta Harp [facility]   If 7PM-7AM, please contact night-coverage [email or web address] 01/06/2020, 9:11 AM

## 2020-01-06 NOTE — Progress Notes (Signed)
Patient refusing to stay and demanding to sign out. Dr. Youlanda Roys paged twice and waiting a return call. Will have night charge nurse follow up with hospitalist as patient is refusing to wait. PIV d/c'd catheter tip intact. AMA form signed by patient

## 2020-01-06 NOTE — ED Triage Notes (Signed)
Pt arrives [facility] from friend's home w cc of drug overdose. Pt friends dumped ice water on pt.  Pt had snoring respirations but never lost pulses. 6 of narcan given total.   Pt is diabetic, BS 151  Pt alert and oriented x3

## 2020-01-06 NOTE — Progress Notes (Signed)
Call returned by Dr. Youlanda Roys at this time.  MD notified that pt has signed out AMA.

## 2020-01-06 NOTE — ED Notes (Signed)
Patient noted to have removed monitor leads and PIV. Reports "It was aggravating me." Discussed with patient reasoning for monitor and PIV. Agreeable to new PIV placement.   Attempted to call patient's sister again. No answer.

## 2020-01-07 NOTE — Discharge Summary (Addendum)
Physician Discharge Summary  Bryan Marks KCL:[id number] DOB: 10/12/1990 DOA: 01/06/2020  PCP: Patient, No Pcp Per  Admit date: 01/06/2020 Discharge date: 01/06/2020  Recommendations for Outpatient Follow-up:  Not done since pt left on AMA  Home Health: none Equipment/Devices: none  Discharge Condition: improved CODE STATUS:full Diet recommendation: regula  Brief/Interim Summary (HPI)  Bryan Marks is a 29 y.o. male with medical history significant of tobacco abuse, who presents with drug overdose.  Per report, EMS was called to the patient's friend'shouse after patient became unresponsive due to accidental drug overdose. When EMS arrived patient was having agonal respirations. Patient's friend had a BVM that he was using on room air and had already given 2 mg of intranasal Narcan. After 6 of narcan in total was given, pt woke up. No CPR was administered. Patient never lost pulses. Patient reports taking 2 xanax to EDP, but denies any other drug use. Patient denies suicidal ideation and reports that the overdose was accidental.Patient denies chest pain, shortness breath, cough.  No nausea vomiting, diarrhea, abdominal pain, symptoms of UTI or unilateral weakness.  Pt was initially hypotensive with BP 77/44 after 3L of LR is given. Currently Bp is 93/58 when I saw pt in ED. NP from ICU, Guy Begin evaluated pt and recommended to admit patient to stepdown and continuation of IV fluid resuscitation.  ED Course: pt was found to have WBC 8.4, troponin 4 -->13, alcohol level less than 10, pending Covid PCR, electrolytes renal function okay, temperature normal, heart rate 51-82, oxygen saturation 96% on room air chest x-ray negative. Pt is placed to SDU for obs.   Subjective  -not collected at discharge since pt left on AMA, I was not in hospital.   Discharge Diagnoses and Hospital Course:   Principal Problem:   Drug overdose Active Problems:   Tobacco abuse   Abnormal EKG    Hypotension due to drugs   Drug overdose and hypotension due to drugs: Blood pressure responded to IV fluid resuscitation.  Initial blood pressure was 77/44, which improved to 93/58 after 3L of LR is given. NP from ICU, Guy Begin evaluated pt and recommended to admit patient to stepdown and continuation of IV fluid resuscitation. His mental status has improved at admission. Pt is drowsy, but is oriented X3, answered all questions appropriately.  Cranial nerves II-XII grossly intact, moves all extremities normally.   -Place to SDU for obs -Monitor blood pressure closely -IVF: 3L of LR, then 125 cc/h -Frequent neuro check -Check Tylenol level and salicylate level - f/u UDS -Unfortunately patient left hospital AMA  Tobacco abuse: -Nicotine patch  Abnormal EKG: No CP or SOB. Trop 5  -->13. EKG showed diffuse ST elevations, but ST segment seems concave up. EDP "discussed with cardiology Dr. Thomes Dinning evaluated patient's EKGs and delta troponin and believe the findings are consistent with benign early repolarization". Pt dose not have any chest pain -cardiac monitoring   Discharge Instructions   Allergies as of 01/06/2020   No Known Allergies     Medication List    You have not been prescribed any medications.     No Known Allergies  Consultations:  Critical care   Procedures/Studies: DG Chest 1 View  Result Date: 12/13/2019 CLINICAL DATA:  Scratch E throat runny nose.  Cough and congestion. EXAM: CHEST  1 VIEW COMPARISON:  No comparison studies available. FINDINGS: The lungs are clear without focal pneumonia, edema, pneumothorax or pleural effusion. The cardiopericardial silhouette is within normal limits for size. The visualized  bony structures of the thorax are intact. IMPRESSION: No active disease. Electronically Signed   By: Kennith Center M.D.   On: 12/13/2019 14:46   DG Chest Portable 1 View  Result Date: 01/06/2020 CLINICAL DATA:  Overdose EXAM: PORTABLE  CHEST 1 VIEW COMPARISON:  12/13/2019 FINDINGS: The heart size and mediastinal contours are within normal limits. Both lungs are clear. The visualized skeletal structures are unremarkable. IMPRESSION: No active disease. Electronically Signed   By: Jasmine Pang M.D.   On: 01/06/2020 01:03       Discharge Exam: Vitals:   01/06/20 1700 01/06/20 1800  BP: 126/75   Pulse: (!) 57 66  Resp: 19 17  Temp:    SpO2: 97% 99%   Vitals:   01/06/20 1500 01/06/20 1600 01/06/20 1700 01/06/20 1800  BP: 119/62 126/72 126/75   Pulse: (!) 58 64 (!) 57 66  Resp: 20 17 19 17   Temp:  98.6 F (37 C)    TempSrc:  Oral    SpO2: 95% 96% 97% 99%  Weight:      Height:       Was not able to perform physical therapy at time patient left hospital on AMA   The results of significant diagnostics from this hospitalization (including imaging, microbiology, ancillary and laboratory) are listed below for reference.     Microbiology: Recent Results (from the past 240 hour(s))  SARS CORONAVIRUS 2 (TAT 6-24 HRS) Nasopharyngeal Nasopharyngeal Swab     Status: None   Collection Time: 01/06/20  6:50 AM   Specimen: Nasopharyngeal Swab  Result Value Ref Range Status   SARS Coronavirus 2 NEGATIVE NEGATIVE Final    Comment: (NOTE) SARS-CoV-2 target nucleic acids are NOT DETECTED. The SARS-CoV-2 RNA is generally detectable in upper and lower respiratory specimens during the acute phase of infection. Negative results do not preclude SARS-CoV-2 infection, do not rule out co-infections with other pathogens, and should not be used as the sole basis for treatment or other patient management decisions. Negative results must be combined with clinical observations, patient history, and epidemiological information. The expected result is Negative. Fact Sheet for Patients: 03/07/20 Fact Sheet for Healthcare Providers: HairSlick.no This test is not yet approved  or cleared by the [place]ir.[place] FDA and  has been authorized for detection and/or diagnosis of SARS-CoV-2 by FDA under an Emergency Use Authorization (EUA). This EUA will remain  in effect (meaning this test can be used) for the duration of the COVID-19 declaration under Section 56 4(b)(1) of the Act, 21 U.S.C. section 360bbb-3(b)(1), unless the authorization is terminated or revoked sooner. Performed at [facility], [address] [place], [place]   Respiratory Panel by RT PCR (Flu A&B, Covid) - Nasopharyngeal Swab     Status: None   Collection Time: 01/06/20  6:50 AM   Specimen: Nasopharyngeal Swab  Result Value Ref Range Status   SARS Coronavirus 2 by RT PCR NEGATIVE NEGATIVE Final    Comment: (NOTE) SARS-CoV-2 target nucleic acids are NOT DETECTED. The SARS-CoV-2 RNA is generally detectable in upper respiratoy specimens during the acute phase of infection. The lowest concentration of SARS-CoV-2 viral copies this assay can detect is 131 copies/mL. A negative result does not preclude SARS-Cov-2 infection and should not be used as the sole basis for treatment or other patient management decisions. A negative result may occur with  improper specimen collection/handling, submission of specimen other than nasopharyngeal swab, presence of viral mutation(s) within the areas targeted by this assay,  and inadequate number of viral copies (<131 copies/mL). A negative result must be combined with clinical observations, patient history, and epidemiological information. The expected result is Negative. Fact Sheet for Patients:  https://www.moore.com/ Fact Sheet for Healthcare Providers:  https://www.young.biz/ This test is not yet ap proved or cleared by the [place] FDA and  has been authorized for detection and/or diagnosis of SARS-CoV-2 by FDA under an Emergency Use Authorization (EUA). This EUA will remain  in effect (meaning  this test can be used) for the duration of the COVID-19 declaration under Section 564(b)(1) of the Act, 21 U.S.C. section 360bbb-3(b)(1), unless the authorization is terminated or revoked sooner.    Influenza A by PCR NEGATIVE NEGATIVE Final   Influenza B by PCR NEGATIVE NEGATIVE Final    Comment: (NOTE) The Xpert Xpress SARS-CoV-2/FLU/RSV assay is intended as an aid in  the diagnosis of influenza from Nasopharyngeal swab specimens and  should not be used as a sole basis for treatment. Nasal washings and  aspirates are unacceptable for Xpert Xpress SARS-CoV-2/FLU/RSV  testing. Fact Sheet for Patients: https://www.moore.com/ Fact Sheet for Healthcare Providers: https://www.young.biz/ This test is not yet approved or cleared by the [place] FDA and  has been authorized for detection and/or diagnosis of SARS-CoV-2 by  FDA under an Emergency Use Authorization (EUA). This EUA will remain  in effect (meaning this test can be used) for the duration of the  Covid-19 declaration under Section 564(b)(1) of the Act, 21  U.S.C. section 360bbb-3(b)(1), unless the authorization is  terminated or revoked. Performed at [facility], [address] [place], [place] [address]   MRSA PCR Screening     Status: None   Collection Time: 01/06/20  9:23 AM   Specimen: Nasal Mucosa; Nasopharyngeal  Result Value Ref Range Status   MRSA by PCR NEGATIVE NEGATIVE Final    Comment:        The GeneXpert MRSA Assay (FDA approved for NASAL specimens only), is one component of a comprehensive MRSA colonization surveillance program. It is not intended to diagnose MRSA infection nor to guide or monitor treatment for MRSA infections. Performed at [facility], [address] [place], [place] [address]      Labs: BNP (last 3 results) No results for input(s): BNP in the last 8760 hours. Basic Metabolic Panel: Recent Labs  Lab 01/06/20 0032   NA 140  K 3.9  CL 102  CO2 29  GLUCOSE 120*  BUN 17  CREATININE 1.12  CALCIUM 9.4   Liver Function Tests: Recent Labs  Lab 01/06/20 0032  AST 41  ALT 26  ALKPHOS 88  BILITOT 0.9  PROT 7.5  ALBUMIN 4.8   No results for input(s): LIPASE, AMYLASE in the last 168 hours. No results for input(s): AMMONIA in the last 168 hours. CBC: Recent Labs  Lab 01/06/20 0032  WBC 8.4  NEUTROABS 5.2  HGB 16.3  HCT 48.4  MCV 86.4  PLT 292   Cardiac Enzymes: No results for input(s): CKTOTAL, CKMB, CKMBINDEX, TROPONINI in the last 168 hours. BNP: Invalid input(s): POCBNP CBG: No results for input(s): GLUCAP in the last 168 hours. D-Dimer No results for input(s): DDIMER in the last 72 hours. Hgb A1c No results for input(s): HGBA1C in the last 72 hours. Lipid Profile No results for input(s): CHOL, HDL, LDLCALC, TRIG, CHOLHDL, LDLDIRECT in the last 72 hours. Thyroid function studies No results for input(s): TSH, T4TOTAL, T3FREE, THYROIDAB in the last 72 hours.  Invalid input(s): FREET3 Anemia  work up No results for input(s): VITAMINB12, FOLATE, FERRITIN, TIBC, IRON, RETICCTPCT in the last 72 hours. Urinalysis    Component Value Date/Time   COLORURINE YELLOW (A) 05/09/2018 1508   APPEARANCEUR CLOUDY (A) 05/09/2018 1508   LABSPEC 1.026 05/09/2018 1508   PHURINE 7.0 05/09/2018 1508   GLUCOSEU NEGATIVE 05/09/2018 1508   HGBUR LARGE (A) 05/09/2018 1508   BILIRUBINUR NEGATIVE 05/09/2018 1508   KETONESUR NEGATIVE 05/09/2018 1508   PROTEINUR 100 (A) 05/09/2018 1508   UROBILINOGEN 2.0 (H) 04/15/2015 1233   NITRITE NEGATIVE 05/09/2018 1508   LEUKOCYTESUR MODERATE (A) 05/09/2018 1508   Sepsis Labs Invalid input(s): PROCALCITONIN,  WBC,  LACTICIDVEN Microbiology Recent Results (from the past 240 hour(s))  SARS CORONAVIRUS 2 (TAT 6-24 HRS) Nasopharyngeal Nasopharyngeal Swab     Status: None   Collection Time: 01/06/20  6:50 AM   Specimen: Nasopharyngeal Swab  Result Value Ref  Range Status   SARS Coronavirus 2 NEGATIVE NEGATIVE Final    Comment: (NOTE) SARS-CoV-2 target nucleic acids are NOT DETECTED. The SARS-CoV-2 RNA is generally detectable in upper and lower respiratory specimens during the acute phase of infection. Negative results do not preclude SARS-CoV-2 infection, do not rule out co-infections with other pathogens, and should not be used as the sole basis for treatment or other patient management decisions. Negative results must be combined with clinical observations, patient history, and epidemiological information. The expected result is Negative. Fact Sheet for Patients: SugarRoll.be Fact Sheet for Healthcare Providers: https://www.woods-mathews.com/ This test is not yet approved or cleared by the [place] FDA and  has been authorized for detection and/or diagnosis of SARS-CoV-2 by FDA under an Emergency Use Authorization (EUA). This EUA will remain  in effect (meaning this test can be used) for the duration of the COVID-19 declaration under Section 56 4(b)(1) of the Act, 21 U.S.C. section 360bbb-3(b)(1), unless the authorization is terminated or revoked sooner. Performed at [facility], [place] [address], [place], [place] [address]   Respiratory Panel by RT PCR (Flu A&B, Covid) - Nasopharyngeal Swab     Status: None   Collection Time: 01/06/20  6:50 AM   Specimen: Nasopharyngeal Swab  Result Value Ref Range Status   SARS Coronavirus 2 by RT PCR NEGATIVE NEGATIVE Final    Comment: (NOTE) SARS-CoV-2 target nucleic acids are NOT DETECTED. The SARS-CoV-2 RNA is generally detectable in upper respiratoy specimens during the acute phase of infection. The lowest concentration of SARS-CoV-2 viral copies this assay can detect is 131 copies/mL. A negative result does not preclude SARS-Cov-2 infection and should not be used as the sole basis for treatment or other patient management decisions. A  negative result may occur with  improper specimen collection/handling, submission of specimen other than nasopharyngeal swab, presence of viral mutation(s) within the areas targeted by this assay, and inadequate number of viral copies (<131 copies/mL). A negative result must be combined with clinical observations, patient history, and epidemiological information. The expected result is Negative. Fact Sheet for Patients:  PinkCheek.be Fact Sheet for Healthcare Providers:  GravelBags.it This test is not yet ap proved or cleared by the [place] FDA and  has been authorized for detection and/or diagnosis of SARS-CoV-2 by FDA under an Emergency Use Authorization (EUA). This EUA will remain  in effect (meaning this test can be used) for the duration of the COVID-19 declaration under Section 564(b)(1) of the Act, 21 U.S.C. section 360bbb-3(b)(1), unless the authorization is terminated or revoked sooner.    Influenza A by  PCR NEGATIVE NEGATIVE Final   Influenza B by PCR NEGATIVE NEGATIVE Final    Comment: (NOTE) The Xpert Xpress SARS-CoV-2/FLU/RSV assay is intended as an aid in  the diagnosis of influenza from Nasopharyngeal swab specimens and  should not be used as a sole basis for treatment. Nasal washings and  aspirates are unacceptable for Xpert Xpress SARS-CoV-2/FLU/RSV  testing. Fact Sheet for Patients: https://www.moore.com/https://www.fda.gov/media/142436/download Fact Sheet for Healthcare Providers: https://www.young.biz/https://www.fda.gov/media/142435/download This test is not yet approved or cleared by the [place] FDA and  has been authorized for detection and/or diagnosis of SARS-CoV-2 by  FDA under an Emergency Use Authorization (EUA). This EUA will remain  in effect (meaning this test can be used) for the duration of the  Covid-19 declaration under Section 564(b)(1) of the Act, 21  U.S.C. section 360bbb-3(b)(1), unless the authorization is   terminated or revoked. Performed at [facility], [address] [place], [place] [address]   MRSA PCR Screening     Status: None   Collection Time: 01/06/20  9:23 AM   Specimen: Nasal Mucosa; Nasopharyngeal  Result Value Ref Range Status   MRSA by PCR NEGATIVE NEGATIVE Final    Comment:        The GeneXpert MRSA Assay (FDA approved for NASAL specimens only), is one component of a comprehensive MRSA colonization surveillance program. It is not intended to diagnose MRSA infection nor to guide or monitor treatment for MRSA infections. Performed at [facility], [address] [place], [place] [address]     Time coordinating discharge:  25 minutes.   SIGNED:  Lorretta HarpXilin Asuna Peth, MD [facility] 01/07/2020, 11:28 AM   If 7PM-7AM, please contact night-coverage [email or web address]

## 2021-11-26 ENCOUNTER — Emergency Department
Admission: EM | Admit: 2021-11-26 | Discharge: 2021-11-26 | Disposition: A | Discharge status: Home / Self Care | Payer: Self-pay | Attending: Emergency Medicine | Admitting: Emergency Medicine

## 2021-11-26 ENCOUNTER — Other Ambulatory Visit: Payer: Self-pay

## 2021-11-26 ENCOUNTER — Emergency Department: Payer: Self-pay

## 2021-11-26 DIAGNOSIS — M65839 Other synovitis and tenosynovitis, unspecified forearm: Secondary | ICD-10-CM

## 2021-11-26 DIAGNOSIS — M65849 Other synovitis and tenosynovitis, unspecified hand: Secondary | ICD-10-CM | POA: Insufficient documentation

## 2021-11-26 MED ORDER — PREDNISONE 10 MG PO TABS: 10.0000 mg | ORAL_TABLET | Freq: Every day | ORAL | 0 refills | Status: DC

## 2021-11-26 NOTE — ED Provider Notes (Signed)
?[facility] EMERGENCY DEPARTMENT ?Provider Note ? ? ?CSN: [id number] ?Arrival date & time: 11/26/21  1906 ? ?  ? ?History ? ?Chief Complaint  ?Patient presents with  ? Arm Pain  ? ? ?Bryan Marks is a 31 y.o. male.  Presents to the emergency department for evaluation of right arm pain.  He points to the second dorsal compartment of the right wrist, has an area of mild soft tissue swelling with no warmth or redness.  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  He has been taking some BC powders with little relief.  He denies any numbness or tingling.  Again, no trauma or injury.  No warmth redness or fevers ? ?HPI ? ?  ? ?Home Medications ?Prior to Admission medications   ?Medication Sig Start Date End Date Taking? Authorizing Provider  ?predniSONE (DELTASONE) 10 MG tablet Take 1 tablet (10 mg total) by mouth daily. 6,5,4,3,2,1 six day taper 11/26/21  Yes Evon Slack, PA-C  ?   ? ?Allergies    ?Patient has no known allergies.   ? ?Review of Systems   ?Review of Systems ? ?Physical Exam ?Updated Vital Signs ?BP 140/88   Pulse 66   Temp 98.8 ?F (37.1 ?C) (Oral)   Resp 17   Ht 6\' 1"  (1.854 m)   Wt 88 kg   SpO2 97%   BMI 25.60 kg/m?  ?Physical Exam ?Constitutional:   ?   Appearance: He is well-developed.  ?HENT:  ?   Head: Normocephalic and atraumatic.  ?Eyes:  ?   Conjunctiva/sclera: Conjunctivae normal.  ?Cardiovascular:  ?   Rate and Rhythm: Normal rate.  ?Pulmonary:  ?   Effort: Pulmonary effort is normal. No respiratory distress.  ?Musculoskeletal:     ?   General: Normal range of motion.  ?   Cervical back: Normal range of motion.  ?   Comments: Examination of the right wrist shows second dorsal compartment with crepitation with wrist range of motion.  There is mild area of soft tissue swelling along the second dorsal compartment.  Patient is able to make a full fist.  Full active extension.  Forearm compartments  are soft.  ?Skin: ?   General: Skin is warm.  ?   Findings: No rash.  ?Neurological:  ?   Mental Status: He is alert and oriented to person, place, and time.  ?Psychiatric:     ?   Behavior: Behavior normal.     ?   Thought Content: Thought content normal.  ? ? ?ED Results / Procedures / Treatments   ?Labs ?(all labs ordered are listed, but only abnormal results are displayed) ?Labs Reviewed - No data to display ? ?EKG ?None ? ?Radiology ?DG Forearm Right ? ?Result Date: 11/26/2021 ?CLINICAL DATA:  Right arm pain and swelling EXAM: RIGHT FOREARM - 2 VIEW COMPARISON:  None. FINDINGS: There is no evidence of fracture or other focal bone lesions. Soft tissues are unremarkable. IMPRESSION: Negative. Electronically Signed   By: 01/26/2022 M.D.   On: 11/26/2021 20:29   ? ?Procedures ?Procedures  ? ?Medications Ordered in ED ?Medications - No data to display ? ?ED Course/ Medical Decision Making/ A&P ?  ?                        ?Medical Decision Making ?Amount and/or Complexity of Data Reviewed ?Radiology: ordered. ? ?Risk ?  Prescription drug management. ? ?31 year old male with right dorsal wrist crepitation, soft tissue swelling.  History and exam consistent with intersection syndrome.  He is placed into a volar wrist brace and placed on a 6-day steroid taper and will follow-up with orthopedics.  Patient with no signs of any infectious process.  His vital signs are stable.  He understands signs and symptoms return to the ER for. ?Final Clinical Impression(s) / ED Diagnoses ?Final diagnoses:  ?Intersection syndrome of wrist  ? ? ?Rx / DC Orders ?ED Discharge Orders   ? ?      Ordered  ?  predniSONE (DELTASONE) 10 MG tablet  Daily       ? 11/26/21 2127  ? ?  ?  ? ?  ? ? ?  ?Evon Slack, PA-C ?11/26/21 2129 ? ?  ?Concha Se, MD ?11/27/21 1146 ? ?

## 2021-11-26 NOTE — ED Triage Notes (Signed)
Pt presents to ER c/o right distal arm pain that started around a week ago after he bumped his arm on something.  Pt has small raised area noted to right arm.  Area is not red or warm to touch.  Pt able to move all finger with some pain.  Pt A&O x4 at this time in NAD.   ?

## 2021-11-26 NOTE — Discharge Instructions (Signed)
Please wear Velcro wrist brace at all times except for showering.  Take prednisone as prescribed.  Call orthopedic office in 1 week if no improvement. ?

## 2021-12-17 ENCOUNTER — Emergency Department
Admission: EM | Admit: 2021-12-17 | Discharge: 2021-12-17 | Disposition: A | Discharge status: Home / Self Care | Payer: Self-pay | Attending: Emergency Medicine | Admitting: Emergency Medicine

## 2021-12-17 ENCOUNTER — Other Ambulatory Visit: Payer: Self-pay

## 2021-12-17 ENCOUNTER — Encounter: Payer: Self-pay | Admitting: Intensive Care

## 2021-12-17 DIAGNOSIS — M791 Myalgia, unspecified site: Secondary | ICD-10-CM | POA: Insufficient documentation

## 2021-12-17 DIAGNOSIS — R0981 Nasal congestion: Secondary | ICD-10-CM | POA: Insufficient documentation

## 2021-12-17 DIAGNOSIS — R6883 Chills (without fever): Secondary | ICD-10-CM | POA: Insufficient documentation

## 2021-12-17 DIAGNOSIS — J111 Influenza due to unidentified influenza virus with other respiratory manifestations: Secondary | ICD-10-CM

## 2021-12-17 DIAGNOSIS — R059 Cough, unspecified: Secondary | ICD-10-CM | POA: Insufficient documentation

## 2021-12-17 DIAGNOSIS — Z20822 Contact with and (suspected) exposure to covid-19: Secondary | ICD-10-CM | POA: Insufficient documentation

## 2021-12-17 LAB — RESP PANEL BY RT-PCR (FLU A&B, COVID) ARPGX2
Influenza A by PCR: NEGATIVE
Influenza B by PCR: NEGATIVE
SARS Coronavirus 2 by RT PCR: NEGATIVE

## 2021-12-17 MED ORDER — NAPROXEN 500 MG PO TABS: 500.0000 mg | ORAL_TABLET | Freq: Two times a day (BID) | ORAL | 0 refills | Status: DC

## 2021-12-17 MED ORDER — ONDANSETRON 4 MG PO TBDP
4.0000 mg | ORAL_TABLET | Freq: Once | ORAL | Status: AC
  Administered 2021-12-17: 4 mg via ORAL
  Filled 2021-12-17: qty 1

## 2021-12-17 MED ORDER — PROMETHAZINE-CODEINE 6.25-10 MG/5ML PO SYRP: 5.0000 mL | ORAL_SOLUTION | Freq: Four times a day (QID) | ORAL | 0 refills | Status: DC | PRN

## 2021-12-17 NOTE — ED Triage Notes (Signed)
Presents with body aches, congestion, chills, and cough X4 days ?

## 2021-12-17 NOTE — ED Notes (Signed)
See triage note presents with body aches,h/a and cough  states sx's started about 3 days ago  low grade temp on arrival ?

## 2021-12-17 NOTE — ED Provider Notes (Signed)
? ?[facility] ?Provider Note ? ? ? Event Date/Time  ? First MD Initiated Contact with Patient 12/17/21 [phone]   ?  (approximate) ? ? ?History  ? ?Generalized Body Aches and Nasal Congestion ? ? ?HPI ? ?Bryan Marks is a 31 y.o. male with no related medical history and as listed in EMR presents to the emergency department for treatment and evaluation of body aches, congestion, chills, cough x4 days.  No relief with over-the-counter medications. ?  ? ? ?Physical Exam  ? ?Triage Vital Signs: ?ED Triage Vitals  ?Enc Vitals Group  ?   BP 12/17/21 0923 129/73  ?   Pulse Rate 12/17/21 0923 84  ?   Resp 12/17/21 0923 18  ?   Temp 12/17/21 0923 99.5 ?F (37.5 ?C)  ?   Temp Source 12/17/21 0923 Oral  ?   SpO2 12/17/21 0923 93 %  ?   Weight 12/17/21 0923 195 lb (88.5 kg)  ?   Height 12/17/21 0923 6\' 1"  (1.854 m)  ?   Head Circumference --   ?   Peak Flow --   ?   Pain Score 12/17/21 0923 10  ?   Pain Loc --   ?   Pain Edu? --   ?   Excl. in GC? --   ? ? ?Most recent vital signs: ?Vitals:  ? 12/17/21 0923  ?BP: 129/73  ?Pulse: 84  ?Resp: 18  ?Temp: 99.5 ?F (37.5 ?C)  ?SpO2: 93%  ? ? ?General: Awake, no distress.  ?CV:  Good peripheral perfusion.  ?Resp:  Normal effort.  Breath sounds clear to auscultation ?Abd:  No distention. No guarding or rebound tenderness. ?Other:   ? ? ?ED Results / Procedures / Treatments  ? ?Labs ?(all labs ordered are listed, but only abnormal results are displayed) ?Labs Reviewed  ?RESP PANEL BY RT-PCR (FLU A&B, COVID) ARPGX2  ? ? ? ?EKG ? ?Not indicated. ? ? ?RADIOLOGY ? ?Image and radiology report reviewed by me. ? ?Not indicated. ? ?PROCEDURES: ? ?Critical Care performed: No ? ?Procedures ? ? ?MEDICATIONS ORDERED IN ED: ?Medications  ?ondansetron (ZOFRAN-ODT) disintegrating tablet 4 mg (4 mg Oral Given 12/17/21 1022)  ? ? ? ?IMPRESSION / MDM / ASSESSMENT AND PLAN / ED COURSE  ? ?I have reviewed the triage note. ? ?Differential diagnosis includes, but is not limited to: COVID,  influenza, acute viral syndrome ? ?31 year old male presenting to the emergency department for treatment and evaluation of symptoms as described in the HPI.  Plan will be to get a COVID and influenza test.  He will be given a dose of Zofran as well. ? ?COVID and influenza testing is negative.  Patient now able to tolerate fluids after Zofran.  Vital signs are stable.  He is afebrile and not tachycardic or hypotensive.  Plan will be to discharge him home with a prescription for Naprosyn and promethazine with codeine which should also help with his nausea as well as cough.  He was encouraged to follow-up with primary care if symptoms or not improving over the next few days.  He is to return to the emergency department for symptoms of change or worsen if he is unable to schedule an appointment. ? ?  ? ? ?FINAL CLINICAL IMPRESSION(S) / ED DIAGNOSES  ? ?Final diagnoses:  ?Influenza-like illness  ? ? ? ?Rx / DC Orders  ? ?ED Discharge Orders   ? ?      Ordered  ?  naproxen (NAPROSYN)  500 MG tablet  2 times daily with meals       ? 12/17/21 1139  ?  promethazine-codeine (PHENERGAN WITH CODEINE) 6.25-10 MG/5ML syrup  Every 6 hours PRN       ? 12/17/21 1139  ? ?  ?  ? ?  ? ? ? ?Note:  This document was prepared using Dragon voice recognition software and may include unintentional dictation errors. ?  Chinita Pester, FNP ?12/17/21 4098 ? ?  ?Minna Antis, MD ?12/18/21 1103 ? ?

## 2022-01-19 ENCOUNTER — Emergency Department (HOSPITAL_COMMUNITY): Payer: Worker's Compensation

## 2022-01-19 ENCOUNTER — Emergency Department (HOSPITAL_COMMUNITY)
Admission: EM | Admit: 2022-01-19 | Discharge: 2022-01-19 | Disposition: A | Discharge status: Home / Self Care | Payer: Worker's Compensation | Attending: Emergency Medicine | Admitting: Emergency Medicine

## 2022-01-19 DIAGNOSIS — Y990 Civilian activity done for income or pay: Secondary | ICD-10-CM | POA: Insufficient documentation

## 2022-01-19 DIAGNOSIS — S61012A Laceration without foreign body of left thumb without damage to nail, initial encounter: Secondary | ICD-10-CM | POA: Diagnosis not present

## 2022-01-19 DIAGNOSIS — Z23 Encounter for immunization: Secondary | ICD-10-CM | POA: Insufficient documentation

## 2022-01-19 DIAGNOSIS — S66222A Laceration of extensor muscle, fascia and tendon of left thumb at wrist and hand level, initial encounter: Secondary | ICD-10-CM | POA: Insufficient documentation

## 2022-01-19 DIAGNOSIS — S6992XA Unspecified injury of left wrist, hand and finger(s), initial encounter: Secondary | ICD-10-CM | POA: Diagnosis present

## 2022-01-19 DIAGNOSIS — S61209A Unspecified open wound of unspecified finger without damage to nail, initial encounter: Secondary | ICD-10-CM

## 2022-01-19 MED ORDER — LIDOCAINE HCL (PF) 1 % IJ SOLN
5.0000 mL | Freq: Once | INTRAMUSCULAR | Status: AC
  Administered 2022-01-19: 5 mL
  Filled 2022-01-19: qty 5

## 2022-01-19 MED ORDER — CEPHALEXIN 500 MG PO CAPS: 500.0000 mg | ORAL_CAPSULE | Freq: Four times a day (QID) | ORAL | 0 refills | Status: DC

## 2022-01-19 MED ORDER — TETANUS-DIPHTH-ACELL PERTUSSIS 5-2.5-18.5 LF-MCG/0.5 IM SUSY
0.5000 mL | PREFILLED_SYRINGE | Freq: Once | INTRAMUSCULAR | Status: AC
  Administered 2022-01-19: 0.5 mL via INTRAMUSCULAR
  Filled 2022-01-19: qty 0.5

## 2022-01-19 NOTE — Discharge Instructions (Signed)
I prescribed you an antibiotic called Keflex.  Please take this 4 times per day for the next 5 days.  Do not stop taking this early. ? ?Below is the contact information for Dr. Izora Ribas.  He is a Producer, television/film/video.  Please give him a call at your earliest convenience and schedule an appointment for reevaluation of your thumb.  You likely have a tendon laceration as well that will need further repair. ? ?Please return to the emergency department with any new or worsening symptoms. ?

## 2022-01-19 NOTE — ED Provider Notes (Signed)
?[facility] ?Provider Note ? ? ?CSN: [id number] ?Arrival date & time: 01/19/22  1408 ? ?  ? ?History ? ?Chief Complaint  ?Patient presents with  ? Extremity Laceration  ? ? ?Bryan Marks is a 31 y.o. male. ? ?HPI ?Patient is a 31 year old male who presents to the emergency department due to a laceration to the left thumb.  Patient is right-hand dominant.  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  Reports pain in the region.  Pain worsens with movement.  He reports difficulty with extension of the thumb.  No numbness.  Unsure of the timing of his last Tdap. ?  ? ?Home Medications ?Prior to Admission medications   ?Medication Sig Start Date End Date Taking? Authorizing Provider  ?cephALEXin (KEFLEX) 500 MG capsule Take 1 capsule (500 mg total) by mouth 4 (four) times daily. 01/19/22  Yes Placido SouJoldersma, Sallie Staron, PA-C  ?naproxen (NAPROSYN) 500 MG tablet Take 1 tablet (500 mg total) by mouth 2 (two) times daily with a meal. 12/17/21   Triplett, Kasandra Knudsenari B, FNP  ?promethazine-codeine (PHENERGAN WITH CODEINE) 6.25-10 MG/5ML syrup Take 5 mLs by mouth every 6 (six) hours as needed for cough. 12/17/21   Chinita Pesterriplett, Cari B, FNP  ?   ? ?Allergies    ?Patient has no known allergies.   ? ?Review of Systems   ?Review of Systems  ?All other systems reviewed and are negative. ?Ten systems reviewed and are negative for acute change, except as noted in the HPI.   ?Physical Exam ?Updated Vital Signs ?BP 123/72   Pulse (!) 51   Temp 98 ?F (36.7 ?C) (Oral)   Resp 16   Ht 6\' 2"  (1.88 m)   Wt 89.8 kg   SpO2 99%   BMI 25.42 kg/m?  ?Physical Exam ?Vitals and nursing note reviewed.  ?Constitutional:   ?   General: He is not in acute distress. ?   Appearance: Normal appearance. He is not ill-appearing, toxic-appearing or diaphoretic.  ?HENT:  ?   Head: Normocephalic and atraumatic.  ?   Right Ear: External ear normal.  ?   Left Ear:  External ear normal.  ?   Nose: Nose normal.  ?   Mouth/Throat:  ?   Mouth: Mucous membranes are moist.  ?   Pharynx: Oropharynx is clear. No oropharyngeal exudate or posterior oropharyngeal erythema.  ?Eyes:  ?   Extraocular Movements: Extraocular movements intact.  ?Cardiovascular:  ?   Rate and Rhythm: Normal rate and regular rhythm.  ?   Pulses: Normal pulses.  ?Pulmonary:  ?   Effort: Pulmonary effort is normal. No respiratory distress.  ?   Breath sounds: No stridor.  ?Abdominal:  ?   General: Abdomen is flat. There is no distension.  ?Musculoskeletal:     ?   General: Normal range of motion.  ?   Cervical back: Normal range of motion and neck supple. No tenderness.  ?Skin: ?   General: Skin is warm and dry.  ?   Comments: 1.5 cm laceration on the dorsum of the left thumb overlying the MCP. No active bleeding. Full flexion in the left thumb but patient unable to actively extend the thumb. Distal sensation intact. Good cap refill. 2+ radial pulse.  ?Neurological:  ?   General: No focal deficit present.  ?   Mental Status: He is alert and oriented to person, place, and time.  ?  Psychiatric:     ?   Mood and Affect: Mood normal.     ?   Behavior: Behavior normal.  ? ?ED Results / Procedures / Treatments   ?Labs ?(all labs ordered are listed, but only abnormal results are displayed) ?Labs Reviewed - No data to display ? ?EKG ?None ? ?Radiology ?DG Finger Thumb Left ? ?Result Date: 01/19/2022 ?CLINICAL DATA:  Possible tendon involvement across posterior aspect of the first metacarpal phalangeal joint. EXAM: LEFT THUMB 2+V COMPARISON:  Report from wrist evaluation of 2020. FINDINGS: No sign of acute fracture or acute bony abnormality. No radiopaque foreign body. Defect in soft tissues overlying the metacarpal phalangeal joint of the first digit suggested based on lucency within the soft tissues along the lateral posterior margin. IMPRESSION: Signs of soft tissue injury without bony abnormality or radiopaque foreign  body. Thumb is held in slight flexion at the MCP joint. Electronically Signed   By: Donzetta Kohut M.D.   On: 01/19/2022 14:59   ? ?Procedures ?Marland Kitchen.Laceration Repair ? ?Date/Time: 01/19/2022 4:45 PM ?Performed by: Placido Sou, PA-C ?Authorized by: Placido Sou, PA-C  ? ?Consent:  ?  Consent obtained:  Verbal ?  Consent given by:  Patient ?  Risks discussed:  Infection, need for additional repair, pain, poor cosmetic result and poor wound healing ?  Alternatives discussed:  No treatment and delayed treatment ?Universal protocol:  ?  Procedure explained and questions answered to patient or proxy's satisfaction: yes   ?  Relevant documents present and verified: yes   ?  Test results available: yes   ?  Imaging studies available: yes   ?  Required blood products, implants, devices, and special equipment available: yes   ?  Site/side marked: yes   ?  Immediately prior to procedure, a time out was called: yes   ?  Patient identity confirmed:  Verbally with patient ?Anesthesia:  ?  Anesthesia method:  Local infiltration ?  Local anesthetic:  Lidocaine 1% w/o epi ?Laceration details:  ?  Location: left thumb. ?  Length (cm):  1.5 ?Pre-procedure details:  ?  Preparation:  Patient was prepped and draped in usual sterile fashion ?Exploration:  ?  Limited defect created (wound extended): no   ?  Hemostasis achieved with:  Direct pressure ?  Imaging obtained: x-ray   ?  Imaging outcome: foreign body not noted   ?  Wound exploration: wound explored through full range of motion   ?  Wound extent: tendon damage   ?  Tendon damage location:  Upper extremity ?  Upper extremity tendon damage location:  Finger extensor ?  Tendon repair plan:  Refer for evaluation ?  Contaminated: yes   ?Treatment:  ?  Area cleansed with:  Shur-Clens, saline and povidone-iodine ?  Amount of cleaning:  Extensive ?Skin repair:  ?  Repair method:  Sutures ?  Suture size:  4-0 ?  Suture material:  Prolene ?  Suture technique:  Simple interrupted ?   Number of sutures:  2 ?Approximation:  ?  Approximation:  Close ?Repair type:  ?  Repair type:  Intermediate ?Post-procedure details:  ?  Dressing:  Splint for protection ?  Procedure completion:  Tolerated well, no immediate complications  ? ?Medications Ordered in ED ?Medications  ?Tdap (BOOSTRIX) injection 0.5 mL (0.5 mLs Intramuscular Given 01/19/22 1623)  ?lidocaine (PF) (XYLOCAINE) 1 % injection 5 mL (5 mLs Infiltration Given 01/19/22 1623)  ? ? ?ED Course/ Medical Decision Making/ A&P ?Clinical Course as  of 01/19/22 1648  ?Tue Jan 19, 2022  ?1531 Patient discussed with Charma Igo, PA-C with orthopedics.  Agrees with cleaning the wound, loose closure, static splint, and outpatient follow-up with Dr. Izora Ribas with hand surgery. [LJ]  ?  ?Clinical Course User Index ?[LJ] Placido Sou, PA-C  ? ?                        ?Medical Decision Making ?Risk ?Prescription drug management. ? ?Pt is a 31 y.o. male who presents to the emergency department due to a laceration along the MCP of the left thumb. ? ?Imaging: ?X-ray of the left thumb shows signs of soft tissue injury without bony abnormality or radiopaque foreign body.  Thumb is held in a slight flexion at the MCP joint. ? ?I, Placido Sou, PA-C, personally reviewed and evaluated these images and lab results as part of my medical decision-making. ? ?On my exam patient has a 1.5 cm linear well approximated laceration overlying the dorsum of the left thumb on the MCP joint.  Patient is able to flex the thumb but has difficulty with any extension at the joint.  Likely tendinous involvement.  Neurovascularly intact distal to the site.  Patient discussed with Charma Igo, PA-C with orthopedics.  Agrees with cleaning the wound, wound closure, as well as follow-up with hand surgery. ? ?Wound was cleaned extensively and closed with 2 Prolene sutures.  Patient tolerated the procedure well.  We will place him in a splint for protection as well as comfort.  Will  discharge on a course of Keflex. ? ?Patient appears stable for discharge at this time and he is agreeable.  Patient given a referral to hand surgery.  Discussed return precautions.  His questions were answered and he

## 2022-01-19 NOTE — ED Triage Notes (Signed)
Pt. 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 ?

## 2022-01-19 NOTE — ED Provider Triage Note (Signed)
Emergency Medicine Provider Triage Evaluation Note ? ?Bryan Marks , a 31 y.o. male  was evaluated in triage.  Pt complains of laceration to left thumb.  Patient reports that he was at work, 2 hours ago, when he lacerated his thumb with a saw.  Patient states he cannot bend thumb.  Patient unsure of last tetanus vaccination. ? ?Review of Systems  ?Positive:  ?Negative:  ? ?Physical Exam  ?BP 136/81 (BP Location: Right Arm)   Pulse 72   Temp 98 ?F (36.7 ?C) (Oral)   Resp 16   Ht 6\' 2"  (1.88 m)   Wt 89.8 kg   SpO2 99%   BMI 25.42 kg/m?  ?Gen:   Awake, no distress   ?Resp:  Normal effort  ?MSK:   Moves extremities without difficulty  ?Other:  Patient able to passively move left thumb.  Possible tendon involvement. ? ?Medical Decision Making  ?Medically screening exam initiated at 2:29 PM.  Appropriate orders placed.  Bryan Marks was informed that the remainder of the evaluation will be completed by another provider, this initial triage assessment does not replace that evaluation, and the importance of remaining in the ED until their evaluation is complete. ? ? ?  ?Bryan Cecil, PA-C ?01/19/22 1430 ? ?

## 2022-03-25 ENCOUNTER — Emergency Department (HOSPITAL_COMMUNITY): Payer: Self-pay

## 2022-03-25 ENCOUNTER — Emergency Department (HOSPITAL_COMMUNITY)
Admission: EM | Admit: 2022-03-25 | Discharge: 2022-03-25 | Disposition: A | Discharge status: Home / Self Care | Payer: Self-pay | Attending: Emergency Medicine | Admitting: Emergency Medicine

## 2022-03-25 ENCOUNTER — Other Ambulatory Visit: Payer: Self-pay

## 2022-03-25 ENCOUNTER — Encounter (HOSPITAL_COMMUNITY): Payer: Self-pay | Admitting: *Deleted

## 2022-03-25 DIAGNOSIS — S91351A Open bite, right foot, initial encounter: Secondary | ICD-10-CM | POA: Insufficient documentation

## 2022-03-25 DIAGNOSIS — Z23 Encounter for immunization: Secondary | ICD-10-CM | POA: Insufficient documentation

## 2022-03-25 DIAGNOSIS — W540XXA Bitten by dog, initial encounter: Secondary | ICD-10-CM | POA: Insufficient documentation

## 2022-03-25 MED ORDER — KETOROLAC TROMETHAMINE 15 MG/ML IJ SOLN
15.0000 mg | Freq: Once | INTRAMUSCULAR | Status: AC
  Administered 2022-03-25: 15 mg via INTRAMUSCULAR
  Filled 2022-03-25: qty 1

## 2022-03-25 MED ORDER — TETANUS-DIPHTH-ACELL PERTUSSIS 5-2.5-18.5 LF-MCG/0.5 IM SUSY
0.5000 mL | PREFILLED_SYRINGE | Freq: Once | INTRAMUSCULAR | Status: AC
  Administered 2022-03-25: 0.5 mL via INTRAMUSCULAR
  Filled 2022-03-25: qty 0.5

## 2022-03-25 MED ORDER — AMOXICILLIN-POT CLAVULANATE 875-125 MG PO TABS: 1.0000 | ORAL_TABLET | Freq: Two times a day (BID) | ORAL | 0 refills | Status: DC

## 2022-03-25 MED ORDER — HYDROCODONE-ACETAMINOPHEN 5-325 MG PO TABS
2.0000 | ORAL_TABLET | Freq: Once | ORAL | Status: AC
  Administered 2022-03-25: 2 via ORAL
  Filled 2022-03-25: qty 2

## 2022-03-25 NOTE — Discharge Instructions (Addendum)
Please contact animal control so they can keep an eye on the dog for the next 10 days.  They will advise you to come back to the emergency department if they see any suspicious activity with the dog to initiate rabies vaccine.  Please take antibiotics as prescribed for the next 7 days.  Please keep wound clean and dry.  Please return to the emergency department for any worsening symptoms you might have.

## 2022-03-25 NOTE — ED Notes (Signed)
Pt left room prior to allowing RN to take d/c vitals.

## 2022-03-25 NOTE — ED Triage Notes (Addendum)
Pt bit by friend's dog, unknown about updated shots. Occurred here in [place]. Pt not cooperative in answering where in  [place] it occurred. Unknown of last tetanus shot.

## 2022-03-25 NOTE — ED Notes (Signed)
Pt provided discharge instructions and prescription information. Pt was given the opportunity to ask questions and questions were answered.   Pt teaching provided on medications that may cause drowsiness. Pt instructed not to drive or operate heavy machinery while taking the prescribed medication. Pt verbalized understanding.   

## 2022-03-25 NOTE — ED Provider Notes (Signed)
[facility] EMERGENCY DEPARTMENT Provider Note   CSN: [id number] Arrival date & time: 03/25/22  1053     History Chief Complaint  Patient presents with   Animal Bite    Kervin Bones is a 31 y.o. male who presents to the emergency department with a dog bite to the right foot that occurred just prior to arrival.  Patient was at his friend's house and they got into a verbal altercation and upon walking out the friend's dog came and grasped the backside of his foot.  Patient is complaining of pain to the right foot.  Denies any other injury.  Patient's tetanus is not up-to-date.  Unknown vaccination status with the friend's dog.   Animal Bite      Home Medications Prior to Admission medications   Medication Sig Start Date End Date Taking? Authorizing Provider  amoxicillin-clavulanate (AUGMENTIN) 875-125 MG tablet Take 1 tablet by mouth every 12 (twelve) hours. 03/25/22  Yes Honor Loh M, PA-C      Allergies    Patient has no known allergies.    Review of Systems   Review of Systems  All other systems reviewed and are negative.   Physical Exam Updated Vital Signs BP 117/83 (BP Location: Left Arm)   Pulse 86   Temp 97.6 F (36.4 C) (Oral)   Resp 20   Ht 6\' 1"  (1.854 m)   Wt 78.7 kg   SpO2 97%   BMI 22.90 kg/m  Physical Exam Vitals and nursing note reviewed.  Constitutional:      Appearance: Normal appearance.  HENT:     Head: Normocephalic and atraumatic.  Eyes:     General:        Right eye: No discharge.        Left eye: No discharge.     Conjunctiva/sclera: Conjunctivae normal.  Pulmonary:     Effort: Pulmonary effort is normal.  Musculoskeletal:     Comments: 2+ dorsalis pedis pulse felt on the right.  Skin:    General: Skin is warm and dry.     Findings: No rash.     Comments: A few puncture wounds over the right medial malleolus, Achilles, and lateral malleolus on the right foot.  Neurological:     General: No focal deficit present.     Mental  Status: He is alert.  Psychiatric:        Mood and Affect: Mood normal.        Behavior: Behavior normal.     ED Results / Procedures / Treatments   Labs (all labs ordered are listed, but only abnormal results are displayed) Labs Reviewed - No data to display  EKG None  Radiology DG Foot Complete Right  Result Date: 03/25/2022 CLINICAL DATA:  Dog bite. EXAM: RIGHT FOOT COMPLETE - 3+ VIEW COMPARISON:  None Available. FINDINGS: There is no evidence of fracture or dislocation. There is no evidence of arthropathy or other focal bone abnormality. Soft tissues are without significant edema. IMPRESSION: Negative. Electronically Signed   By: 03/27/2022 M.D.   On: 03/25/2022 12:58    Procedures Procedures    Medications Ordered in ED Medications  Tdap (BOOSTRIX) injection 0.5 mL (has no administration in time range)  HYDROcodone-acetaminophen (NORCO/VICODIN) 5-325 MG per tablet 2 tablet (has no administration in time range)  ketorolac (TORADOL) 15 MG/ML injection 15 mg (15 mg Intramuscular Given 03/25/22 1346)    ED Course/ Medical Decision Making/ A&P Clinical Course as of 03/25/22 1517  Thu Mar 25, 2022  1347 DG Foot Complete Right Imaging of the right foot which I personally reviewed is normal.  No evidence of fractures or dislocations.  I do agree with radiologist interpretation. [CF]    Clinical Course User Index [CF] Teressa Lower, PA-C                           Medical Decision Making Pranish Akhavan is a 31 y.o. male patient who presents to the emergency department with a dog bite to the right foot.  Bleeding is controlled prior to arrival and there is no pulsatile bleeding.  Patient is neurovascular intact distal to the injuries with a strong pulse.  No evidence of compartment syndrome.  Ernie Hew place the patient on antibiotics.  The dog is known to the patient.  He will be contacting animal control to monitor the dog.  Do not feel that initiating rabies vaccine  series today is necessary.  Tetanus also updated today.  Strict turn precautions were discussed.  He is safe for discharge.   Amount and/or Complexity of Data Reviewed Radiology: ordered. Decision-making details documented in ED Course.  Risk Prescription drug management.   Final Clinical Impression(s) / ED Diagnoses Final diagnoses:  Dog bite, initial encounter    Rx / DC Orders ED Discharge Orders          Ordered    amoxicillin-clavulanate (AUGMENTIN) 875-125 MG tablet  Every 12 hours        03/25/22 1501              Honor Loh [place], [place] 03/25/22 1517    Bethann Berkshire, MD 03/26/22 [phone]

## 2022-03-25 NOTE — ED Notes (Signed)
Puncture wounds to bilateral feet. R>L.

## 2022-08-31 ENCOUNTER — Other Ambulatory Visit: Payer: Self-pay

## 2022-08-31 ENCOUNTER — Emergency Department (HOSPITAL_COMMUNITY)
Admission: EM | Admit: 2022-08-31 | Discharge: 2022-08-31 | Disposition: A | Discharge status: Home / Self Care | Payer: Self-pay | Attending: Emergency Medicine | Admitting: Emergency Medicine

## 2022-08-31 DIAGNOSIS — R42 Dizziness and giddiness: Secondary | ICD-10-CM | POA: Insufficient documentation

## 2022-08-31 LAB — CBC WITH DIFFERENTIAL/PLATELET
Abs Immature Granulocytes: 0.05 10*3/uL (ref 0.00–0.07)
Basophils Absolute: 0 10*3/uL (ref 0.0–0.1)
Basophils Relative: 1 %
Eosinophils Absolute: 0.1 10*3/uL (ref 0.0–0.5)
Eosinophils Relative: 2 %
HCT: 45.9 % (ref 39.0–52.0)
Hemoglobin: 15.3 g/dL (ref 13.0–17.0)
Immature Granulocytes: 1 %
Lymphocytes Relative: 27 %
Lymphs Abs: 2.2 10*3/uL (ref 0.7–4.0)
MCH: 29 pg (ref 26.0–34.0)
MCHC: 33.3 g/dL (ref 30.0–36.0)
MCV: 87.1 fL (ref 80.0–100.0)
Monocytes Absolute: 0.7 10*3/uL (ref 0.1–1.0)
Monocytes Relative: 9 %
Neutro Abs: 4.9 10*3/uL (ref 1.7–7.7)
Neutrophils Relative %: 60 %
Platelets: 235 10*3/uL (ref 150–400)
RBC: 5.27 MIL/uL (ref 4.22–5.81)
RDW: 12.8 % (ref 11.5–15.5)
WBC: 8 10*3/uL (ref 4.0–10.5)
nRBC: 0 % (ref 0.0–0.2)

## 2022-08-31 LAB — BASIC METABOLIC PANEL
Anion gap: 7 (ref 5–15)
BUN: 16 mg/dL (ref 6–20)
CO2: 28 mmol/L (ref 22–32)
Calcium: 8.7 mg/dL — ABNORMAL LOW (ref 8.9–10.3)
Chloride: 105 mmol/L (ref 98–111)
Creatinine, Ser: 0.81 mg/dL (ref 0.61–1.24)
GFR, Estimated: >60 mL/min (ref >60)
Glucose, Bld: 90 mg/dL (ref 70–99)
Potassium: 4.1 mmol/L (ref 3.5–5.1)
Sodium: 140 mmol/L (ref 135–145)

## 2022-08-31 NOTE — ED Triage Notes (Signed)
Pt states he started feeling dizzy yesterday and believes he is dehydrated

## 2022-08-31 NOTE — ED Provider Notes (Signed)
[facility] EMERGENCY DEPARTMENT Provider Note   CSN: [id number] Arrival date & time: 08/31/22  0431     History  Chief Complaint  Patient presents with   Dehydration    Bryan Marks is a 31 y.o. male.  31 year old male without any significant past medical history who presents to the ER today secondary to concerns for dehydration.  Patient states has been dehydrated in the past and feels similar to this.  He states has been eating and drinking okay.  No diarrhea or constipation.  No nausea or vomiting.  He is urinating okay.  States he got dizzy a couple times and thought he might be dehydrated so came here to see if he can get some IV fluids like he had in the past.  He does not complain of a dry mouth or any other associated symptoms.  No recent illnesses.        Home Medications Prior to Admission medications   Not on File      Allergies    Patient has no known allergies.    Review of Systems   Review of Systems  Physical Exam Updated Vital Signs BP 112/71   Pulse 67   Temp 97.6 F (36.4 C) (Oral)   Resp 13   Ht 6\' 1"  (1.854 m)   Wt 81.6 kg   SpO2 100%   BMI 23.75 kg/m  Physical Exam Vitals and nursing note reviewed.  Constitutional:      Appearance: He is well-developed.  HENT:     Head: Normocephalic and atraumatic.     Mouth/Throat:     Mouth: Mucous membranes are moist.     Pharynx: No oropharyngeal exudate or posterior oropharyngeal erythema.  Eyes:     Pupils: Pupils are equal, round, and reactive to light.     Comments: Not sunken  Cardiovascular:     Rate and Rhythm: Normal rate.  Pulmonary:     Effort: Pulmonary effort is normal. No respiratory distress.  Abdominal:     General: Abdomen is flat. There is no distension.  Musculoskeletal:        General: No swelling or tenderness. Normal range of motion.     Cervical back: Normal range of motion.  Skin:    General: Skin is warm and dry.  Neurological:     General: No focal deficit  present.     Mental Status: He is alert.     ED Results / Procedures / Treatments   Labs (all labs ordered are listed, but only abnormal results are displayed) Labs Reviewed  BASIC METABOLIC PANEL - Abnormal; Notable for the following components:      Result Value   Calcium 8.7 (*)    All other components within normal limits  CBC WITH DIFFERENTIAL/PLATELET    EKG None  Radiology No results found.  Procedures Procedures    Medications Ordered in ED Medications - No data to display  ED Course/ Medical Decision Making/ A&P                           Medical Decision Making Amount and/or Complexity of Data Reviewed Labs: ordered. ECG/medicine tests: ordered.   Vital signs within normal limits.  Physical exam not consistent with dehydration.  Tolerating p.o. fluids.  Kidney function is baseline no evidence of hemoconcentration on his CBC.  I think it is very unlikely he is significantly dehydrated.  He is not dizzy here.  Stable for discharge  at this time.  Final Clinical Impression(s) / ED Diagnoses Final diagnoses:  Light headedness    Rx / DC Orders ED Discharge Orders     None         Andreanna Mikolajczak, Barbara Cower, MD 08/31/22 0700

## 2023-05-02 ENCOUNTER — Emergency Department
Admission: EM | Admit: 2023-05-02 | Discharge: 2023-05-02 | Disposition: A | Discharge status: Home / Self Care | Payer: Self-pay | Attending: Emergency Medicine | Admitting: Emergency Medicine

## 2023-05-02 ENCOUNTER — Other Ambulatory Visit: Payer: Self-pay

## 2023-05-02 ENCOUNTER — Encounter: Payer: Self-pay | Admitting: Emergency Medicine

## 2023-05-02 DIAGNOSIS — L03115 Cellulitis of right lower limb: Secondary | ICD-10-CM | POA: Insufficient documentation

## 2023-05-02 MED ORDER — SULFAMETHOXAZOLE-TRIMETHOPRIM 800-160 MG PO TABS: 1.0000 | ORAL_TABLET | Freq: Two times a day (BID) | ORAL | 0 refills | Status: DC

## 2023-05-02 MED ORDER — SULFAMETHOXAZOLE-TRIMETHOPRIM 800-160 MG PO TABS
1.0000 | ORAL_TABLET | Freq: Once | ORAL | Status: AC
  Administered 2023-05-02: 1 via ORAL
  Filled 2023-05-02: qty 1

## 2023-05-02 NOTE — ED Notes (Signed)
Called for room  No answer  

## 2023-05-02 NOTE — ED Triage Notes (Signed)
Patient to ED via POV for spider bites to right upper thigh. States got bit 2-3 days ago. Redness and swelling noted.

## 2023-05-02 NOTE — ED Notes (Signed)
Called for room no answer

## 2023-05-02 NOTE — ED Provider Notes (Signed)
[facility] Provider Note  Patient Contact: 4:44 PM (approximate)   History   Insect Bite   HPI  Bryan Marks is a 32 y.o. male who presents the emergency department complaining of a possible infection to the right leg.  Patient believes a insect may have bit him he has an erythematous and edematous region to the right thigh.  No purulent drainage.  No fevers or chills.     Physical Exam   Triage Vital Signs: ED Triage Vitals  Encounter Vitals Group     BP 05/02/23 1431 133/87     Systolic BP Percentile --      Diastolic BP Percentile --      Pulse Rate 05/02/23 1431 74     Resp 05/02/23 1431 18     Temp 05/02/23 1431 98.2 F (36.8 C)     Temp Source 05/02/23 1431 Oral     SpO2 05/02/23 1431 98 %     Weight 05/02/23 1432 198 lb (89.8 kg)     Height 05/02/23 1432 6\' 1"  (1.854 m)     Head Circumference --      Peak Flow --      Pain Score 05/02/23 1431 8     Pain Loc --      Pain Education --      Exclude from Growth Chart --     Most recent vital signs: Vitals:   05/02/23 1431  BP: 133/87  Pulse: 74  Resp: 18  Temp: 98.2 F (36.8 C)  SpO2: 98%     General: Alert and in no acute distress.  Cardiovascular:  Good peripheral perfusion Respiratory: Normal respiratory effort without tachypnea or retractions. Lungs CTAB.  Musculoskeletal: Full range of motion to all extremities.  Neurologic:  No gross focal neurologic deficits are appreciated.  Skin:   No rash noted.  Visualization of the right thigh reveals an erythematous and edematous area consistent with cellulitis.  Indurated but not fluctuant.  No purulent drainage. Other:   ED Results / Procedures / Treatments   Labs (all labs ordered are listed, but only abnormal results are displayed) Labs Reviewed - No data to display   EKG     RADIOLOGY    No results found.  PROCEDURES:  Critical Care performed: No  Ultrasound ED Soft Tissue  Date/Time: 05/02/2023 5:20  PM  Performed by: Racheal Patches, PA-C Authorized by: Racheal Patches, PA-C   Procedure details:    Indications: localization of abscess     Transverse view:  Visualized   Longitudinal view:  Visualized   Images: not archived   Location:    Location: lower extremity     Side:  Right Findings:     no abscess present    cellulitis present    MEDICATIONS ORDERED IN ED: Medications  sulfamethoxazole-trimethoprim (BACTRIM DS) 800-160 MG per tablet 1 tablet (1 tablet Oral Given 05/02/23 1745)     IMPRESSION / MDM / ASSESSMENT AND PLAN / ED COURSE  I reviewed the triage vital signs and the nursing notes.                                 Differential diagnosis includes, but is not limited to, cellulitis, abscess, insect bite   Patient's presentation is most consistent with acute presentation with potential threat to life or bodily function.   Patient's diagnosis is consistent with cellulitis.  Patient presents  emergency department complaining of an erythematous and edematous and painful lesion to the right thigh.  He believes that an insect bit him.  Area appeared to be cellulitic in nature.  Undetermined source.  Bedside ultrasound reveals no evidence of fluid collection concerning for abscess.  Patient will be placed on antibiotics with MRSA coverage.  Bactrim prescribed for the patient.  Concerning signs and symptoms and return precautions discussed.  Patient is stable for discharge at this time..  Patient is given ED precautions to return to the ED for any worsening or new symptoms.     FINAL CLINICAL IMPRESSION(S) / ED DIAGNOSES   Final diagnoses:  Cellulitis of right lower extremity     Rx / DC Orders   ED Discharge Orders          Ordered    sulfamethoxazole-trimethoprim (BACTRIM DS) 800-160 MG tablet  2 times daily        05/02/23 1739             Note:  This document was prepared using Dragon voice recognition software and may include  unintentional dictation errors.   Lanette Hampshire 05/02/23 Elson Clan, MD 05/03/23 [phone]

## 2023-09-19 ENCOUNTER — Emergency Department
Admission: EM | Admit: 2023-09-19 | Discharge: 2023-09-19 | Disposition: A | Discharge status: Home / Self Care | Payer: Self-pay | Attending: Emergency Medicine | Admitting: Emergency Medicine

## 2023-09-19 ENCOUNTER — Other Ambulatory Visit: Payer: Self-pay

## 2023-09-19 ENCOUNTER — Encounter: Payer: Self-pay | Admitting: Emergency Medicine

## 2023-09-19 ENCOUNTER — Emergency Department: Admission: EM | Admit: 2023-09-19 | Discharge: 2023-09-19 | Discharge status: Against Medical Advice | Payer: Self-pay

## 2023-09-19 DIAGNOSIS — K029 Dental caries, unspecified: Secondary | ICD-10-CM | POA: Insufficient documentation

## 2023-09-19 MED ORDER — NAPROXEN 500 MG PO TABS: 500.0000 mg | ORAL_TABLET | Freq: Two times a day (BID) | ORAL | 2 refills | Status: AC

## 2023-09-19 MED ORDER — LIDOCAINE-EPINEPHRINE 2 %-1:100000 IJ SOLN
1.7000 mL | Freq: Once | INTRAMUSCULAR | Status: AC
  Administered 2023-09-19: 1.7 mL
  Filled 2023-09-19: qty 1.7

## 2023-09-19 MED ORDER — AMOXICILLIN 875 MG PO TABS: 875.0000 mg | ORAL_TABLET | Freq: Two times a day (BID) | ORAL | 0 refills | Status: AC

## 2023-09-19 NOTE — ED Provider Notes (Signed)
[facility] Emergency Department Provider Note     Event Date/Time   First MD Initiated Contact with Patient 09/19/23 1336     (approximate)   History   Dental Pain   HPI  Bryan Marks is a 32 y.o. male with a history of tobacco abuse and drug overdose presents to the ED with complaint of dental pain of the left lower second molar x 3 days.  He has tried Orajel with no relief.  Patient reports he does not have a dentist.  Denies fever and chills.     Physical Exam   Triage Vital Signs: ED Triage Vitals [09/19/23 1329]  Encounter Vitals Group     BP (!) 155/90     Systolic BP Percentile      Diastolic BP Percentile      Pulse Rate 84     Resp 18     Temp 97.9 F (36.6 C)     Temp Source Oral     SpO2 96 %     Weight 195 lb (88.5 kg)     Height 6\' 1"  (1.854 m)     Head Circumference      Peak Flow      Pain Score 10     Pain Loc      Pain Education      Exclude from Growth Chart     Most recent vital signs: Vitals:   09/19/23 1329  BP: (!) 155/90  Pulse: 84  Resp: 18  Temp: 97.9 F (36.6 C)  SpO2: 96%    General Awake, no distress.  HEENT NCAT. PERRL. EOMI. No rhinorrhea. Mucous membranes are moist.  CV:  Good peripheral perfusion.  RESP:  Normal effort.  ABD:  No distention.  Other:  Lower left second molar reveals broken in 1/2 and tooth decay.  No abscess palpated.  Poor overall dentition.   ED Results / Procedures / Treatments   Labs (all labs ordered are listed, but only abnormal results are displayed) Labs Reviewed - No data to display  No results found.  PROCEDURES:  Critical Care performed: No  Dental Block  Date/Time: 09/19/2023 2:16 PM  Performed by: Conrad Minneota, PA-C Authorized by: Conrad Reedy, PA-C   Consent:    Consent obtained:  Verbal   Consent given by:  Patient   Risks, benefits, and alternatives were discussed: yes     Risks discussed:  Infection, allergic reaction, swelling,  unsuccessful block, pain and nerve damage Indications:    Indications: dental pain   Location:    Block type:  Inferior alveolar   Laterality:  Left Procedure details:    Syringe type:  Controlled syringe   Needle gauge:  25 G   Anesthetic injected:  Lidocaine 2% WITH epi   Injection procedure:  Anatomic landmarks identified and anatomic landmarks palpated Post-procedure details:    Outcome:  Pain relieved   Procedure completion:  Tolerated   MEDICATIONS ORDERED IN ED: Medications  lidocaine-EPINEPHrine (XYLOCAINE W/EPI) 2 %-1:100000 (with pres) injection 1.7 mL (1.7 mLs Infiltration Given by Other 09/19/23 1407)   IMPRESSION / MDM / ASSESSMENT AND PLAN / ED COURSE  I reviewed the triage vital signs and the nursing notes.                               32 y.o. male presents to the emergency department for evaluation and treatment of dental  pain. See HPI for further details.   Differential diagnosis includes, but is not limited to abscess, dental carry, tooth fracture  Patient's presentation is most consistent with acute, uncomplicated illness.  Patient is alert and oriented.  He is hemodynamically stable.  Physical exam findings are stated above.  I do suspect patient will need dental extraction of the tooth causing pain.  Patient tolerated dental block excessively and reports immediate relief in pain.  Patient is discharged with naproxen and amoxicillin for infection prevention and pain.  Patient is in stable condition for discharge home.  Encouraged to follow-up with dentist.  A list has been provided for him for further management.  ED precautions discussed.  FINAL CLINICAL IMPRESSION(S) / ED DIAGNOSES   Final diagnoses:  Dental caries    Rx / DC Orders   ED Discharge Orders          Ordered    amoxicillin (AMOXIL) 875 MG tablet  2 times daily        09/19/23 1414    naproxen (NAPROSYN) 500 MG tablet  2 times daily with meals        09/19/23 1414              Note:  This document was prepared using Dragon voice recognition software and may include unintentional dictation errors.    Kern Reap A, PA-C 09/19/23 1451    Jene Every, MD 09/19/23 1455

## 2023-09-19 NOTE — ED Notes (Signed)
See triage note  Presents with dental pain  States he has had issues with this tooth for a while  Pain has increased over the past 3 days  subjective fever

## 2023-09-19 NOTE — Discharge Instructions (Signed)
Follow-up with a dentist for further evaluation and possible tooth extraction.

## 2023-09-19 NOTE — ED Triage Notes (Signed)
Patient to ED via POV fro left lower dental pain. Ongoing x3 days.

## 2023-09-22 ENCOUNTER — Emergency Department
Admission: EM | Admit: 2023-09-22 | Discharge: 2023-09-22 | Disposition: A | Discharge status: Home / Self Care | Payer: Self-pay | Attending: Emergency Medicine | Admitting: Emergency Medicine

## 2023-09-22 ENCOUNTER — Other Ambulatory Visit: Payer: Self-pay

## 2023-09-22 DIAGNOSIS — K0889 Other specified disorders of teeth and supporting structures: Secondary | ICD-10-CM | POA: Insufficient documentation

## 2023-09-22 DIAGNOSIS — F17200 Nicotine dependence, unspecified, uncomplicated: Secondary | ICD-10-CM | POA: Insufficient documentation

## 2023-09-22 MED ORDER — KETOROLAC TROMETHAMINE 15 MG/ML IJ SOLN
15.0000 mg | Freq: Once | INTRAMUSCULAR | Status: AC
  Administered 2023-09-22: 15 mg via INTRAMUSCULAR
  Filled 2023-09-22: qty 1

## 2023-09-22 NOTE — ED Provider Notes (Signed)
[facility] Provider Note    Event Date/Time   First MD Initiated Contact with Patient 09/22/23 0932     (approximate)   History   Abscess and Dental Pain   HPI  Luvern Finkbiner is a 32 y.o. male with a history of substance use who presents today for evaluation of dental pain.  Patient reports that it is his left lower molar that is bothering him and has been bothering him for the last week.  He denies fevers or chills.  He has not had any facial or neck swelling.  He has not had any trouble swallowing.  He has not yet seen a dentist.  Patient Active Problem List   Diagnosis Date Noted   Drug overdose 01/06/2020   Abnormal EKG 01/06/2020   Tobacco abuse    Hypotension due to drugs           Physical Exam   Triage Vital Signs: ED Triage Vitals [09/22/23 0931]  Encounter Vitals Group     BP      Systolic BP Percentile      Diastolic BP Percentile      Pulse      Resp      Temp      Temp src      SpO2 98 %     Weight      Height      Head Circumference      Peak Flow      Pain Score      Pain Loc      Pain Education      Exclude from Growth Chart     Most recent vital signs: Vitals:   09/22/23 0931 09/22/23 0938  BP:  (!) 142/88  Pulse:  80  Resp:  18  Temp:  97.6 F (36.4 C)  SpO2: 98% 100%    Physical Exam Vitals and nursing note reviewed.  Constitutional:      General: Awake and alert. No acute distress.    Appearance: Normal appearance. The patient is normal weight.  HENT:     Head: Normocephalic and atraumatic.     Mouth: Mucous membranes are moist.  Poor dentition diffusely.  Tooth #18 with obvious decay and large cavity, no gingival fluctuance.  No sublingual swelling or woodiness.  No facial or neck swelling or erythema.  No trismus or drooling. Eyes:     General: PERRL. Normal EOMs        Right eye: No discharge.        Left eye: No discharge.     Conjunctiva/sclera: Conjunctivae normal.  Cardiovascular:      Rate and Rhythm: Normal rate and regular rhythm.     Pulses: Normal pulses.  Pulmonary:     Effort: Pulmonary effort is normal. No respiratory distress.     Breath sounds: Normal breath sounds.  Abdominal:     Abdomen is soft. There is no abdominal tenderness. No rebound or guarding. No distention. Musculoskeletal:        General: No swelling. Normal range of motion.     Cervical back: Normal range of motion and neck supple.  Skin:    General: Skin is warm and dry.     Capillary Refill: Capillary refill takes less than 2 seconds.     Findings: No rash.  Neurological:     Mental Status: The patient is awake and alert.      ED Results / Procedures / Treatments   Labs (  all labs ordered are listed, but only abnormal results are displayed) Labs Reviewed - No data to display   EKG     RADIOLOGY     PROCEDURES:  Critical Care performed:   Procedures   MEDICATIONS ORDERED IN ED: Medications  ketorolac (TORADOL) 15 MG/ML injection 15 mg (15 mg Intramuscular Given 09/22/23 0956)     IMPRESSION / MDM / ASSESSMENT AND PLAN / ED COURSE  I reviewed the triage vital signs and the nursing notes.   Differential diagnosis includes, but is not limited to, dental caries, dental decay, pulpitis, abscess.  I reviewed the patient's chart.  Patient was seen in the emergency department on 09/19/2023 with the same complaint and was discharged on amoxicillin and naproxen.  Patient is awake and alert, hemodynamically stable and afebrile.  He is nontoxic in appearance.  He has obvious decay with a large dental caries to tooth #18.  No gingival swelling or fluctuance concerning for gingival abscess.  No trismus, nuchal rigidity, neck pain, hot potato voice, uvular deviation or malocclusion to suggest deep space infection. No sublingual swelling concerning for Ludwig's angina.  Patient was encouraged to continue the antibiotics that were prescribed to him previously.  Patient was treated  symptomatically in the emergency department.  He was given a list of low-cost dental clinics.  Discussed care plan, return precautions, and advised close outpatient follow-up with dentist. Patient agrees with plan of care.   Patient's presentation is most consistent with exacerbation of chronic illness.    FINAL CLINICAL IMPRESSION(S) / ED DIAGNOSES   Final diagnoses:  Pain, dental     Rx / DC Orders   ED Discharge Orders     None        Note:  This document was prepared using Dragon voice recognition software and may include unintentional dictation errors.   Jackelyn Hoehn, PA-C 09/22/23 1025    Janith Lima, MD 09/22/23 [phone]

## 2023-09-22 NOTE — ED Triage Notes (Signed)
Patient to ED via [facility] for L lower dental pain. This has been going on for about a week. Patient is currently taking Naproxen and Amoxicillin for an abscess in his L lower mouth. Patient is currently able to talk in complete sentences and is in NAD. Patient states his "pain is a 20". Patient complains of swelling in his L jaw, cheek, and states "the infection is going up to my head". Patient is currently homeless.

## 2023-09-22 NOTE — Discharge Instructions (Signed)
It is very important that you see a dentist.  Please follow-up with your outpatient provider.  Please return for any new, worsening, or change in symptoms or other concerns.  It was a pleasure caring for you today.

## 2023-10-26 NOTE — Congregational Nurse Program (Signed)
  Dept: [phone]   Congregational Nurse Program Note  Date of Encounter: 10/26/2023 Client to [facility] day center nurse led clinic with complaints of a painful sore to his left posterior heel. Quarter size area of hard raised skin, with erythema surrounding it. Very tender to touch, appears to have some infection under the skin. Foot soaked in epsom salt water, dried and covered with a nonadherent dressing. He denied any type of puncture injury or needle use. RN advised client to seek medical care as the area does appear to have some type of infection in it. He voiced understanding. Client at this time does not have insurance, RN to assist with medicaid application at client's next visit to the center/clinic.  He is currently homeless, living in a tent or staying a warming center.k Merilynn Finland BSN, RN Past Medical History: Past Medical History:  Diagnosis Date   Tobacco abuse     Encounter Details:  Community Questionnaire - 10/26/23 1120       Questionnaire   Ask client: Do you give verbal consent for me to treat you today? Yes    Student Assistance N/A    Location Patient Served  [facility]    Encounter Setting CN site    Population Status Unhoused    Insurance Unknown    Insurance/Financial Assistance Referral N/A    Medication Have Medication Insecurities    Medical Provider No    Screening Referrals Made N/A    Medical Referrals Made ED    Medical Appointment Completed N/A    CNP Interventions Advocate/Support;Case Management;Educate    Screenings CN Performed N/A    ED Visit Averted N/A    Life-Saving Intervention Made N/A

## 2024-03-20 IMAGING — CR DG FINGER THUMB 2+V*L*
3 series · 3 of 3 positions shown · non-contrast
Comparison: Report from wrist evaluation of 2020.

CLINICAL DATA: Possible tendon involvement across posterior aspect
of the first metacarpal phalangeal joint.

EXAM:
LEFT THUMB 2+V

[finger ap]
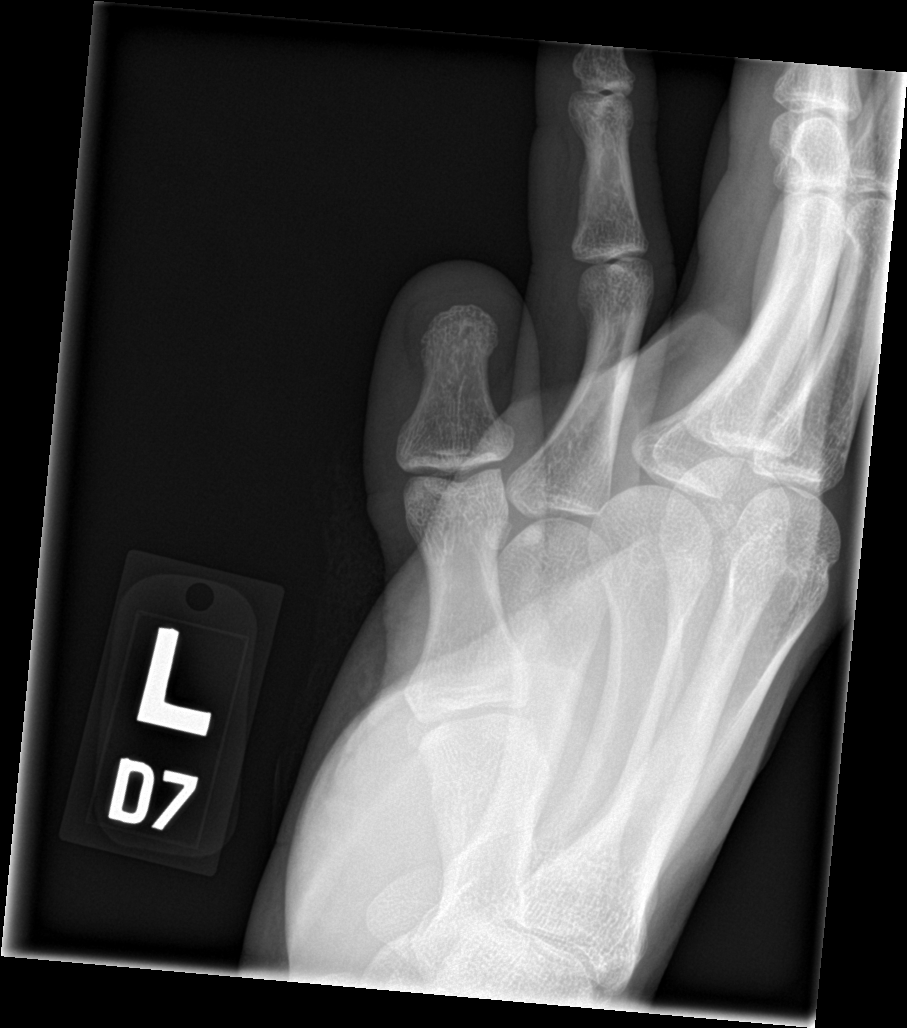

[finger obl]
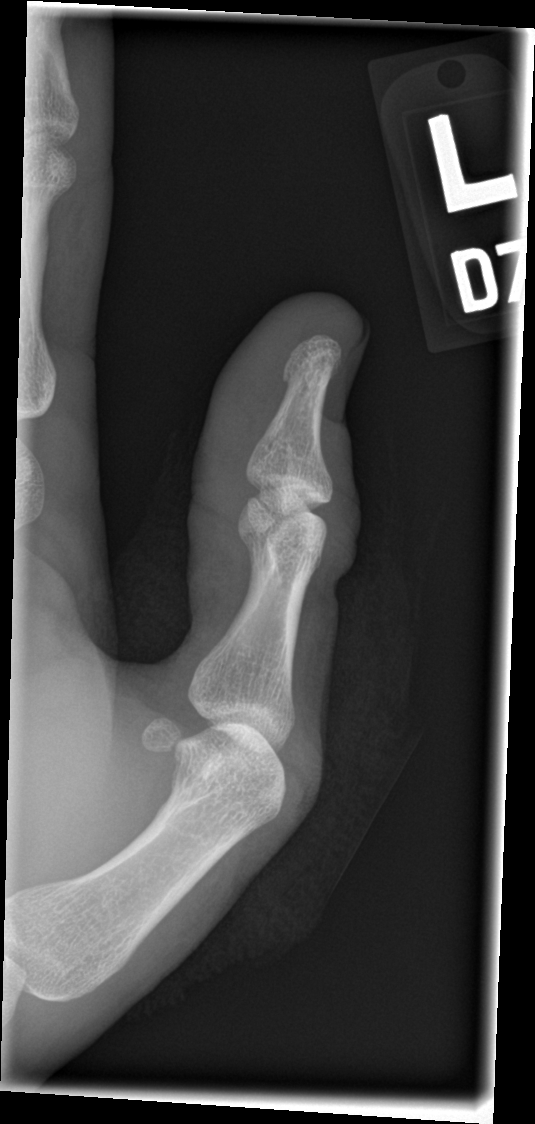

[finger lat]
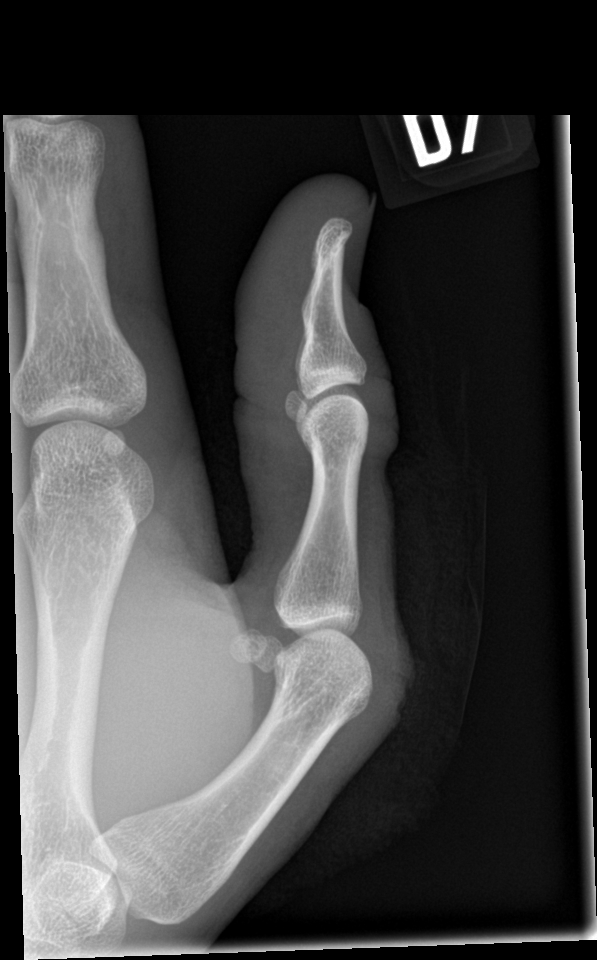

[3 of 3 positions shown; findings below may reference images not displayed]

FINDINGS: No sign of acute fracture or acute bony abnormality. No radiopaque
foreign body. Defect in soft tissues overlying the metacarpal
phalangeal joint of the first digit suggested based on lucency
within the soft tissues along the lateral posterior margin.
IMPRESSION: Signs of soft tissue injury without bony abnormality or radiopaque
foreign body. Thumb is held in slight flexion at the MCP joint.
